# Patient Record
Sex: Male | Born: 1975 | Race: Black or African American | Hispanic: No | Marital: Single | Smoking: Current every day smoker
Health system: Southern US, Community
[De-identification: ages and names within clinical notes are randomized; demographics above are authoritative.]

## PROBLEM LIST (undated history)

## (undated) DIAGNOSIS — M25569 Pain in unspecified knee: Secondary | ICD-10-CM

## (undated) DIAGNOSIS — K219 Gastro-esophageal reflux disease without esophagitis: Secondary | ICD-10-CM

## (undated) DIAGNOSIS — G8929 Other chronic pain: Secondary | ICD-10-CM

## (undated) HISTORY — PX: OTHER SURGICAL HISTORY: SHX169

---

## 1999-01-28 ENCOUNTER — Emergency Department (HOSPITAL_COMMUNITY): Admission: EM | Admit: 1999-01-28 | Discharge: 1999-01-28 | Payer: Self-pay | Admitting: Emergency Medicine

## 2003-09-06 ENCOUNTER — Emergency Department (HOSPITAL_COMMUNITY): Admission: EM | Admit: 2003-09-06 | Discharge: 2003-09-06 | Payer: Self-pay | Admitting: Emergency Medicine

## 2008-10-04 ENCOUNTER — Emergency Department (HOSPITAL_COMMUNITY): Admission: EM | Admit: 2008-10-04 | Discharge: 2008-10-04 | Payer: Self-pay | Admitting: Emergency Medicine

## 2010-11-15 ENCOUNTER — Emergency Department (HOSPITAL_COMMUNITY)
Admission: EM | Admit: 2010-11-15 | Discharge: 2010-11-15 | Disposition: A | Discharge status: Home / Self Care | Payer: Self-pay | Attending: Emergency Medicine | Admitting: Emergency Medicine

## 2010-11-15 DIAGNOSIS — F17200 Nicotine dependence, unspecified, uncomplicated: Secondary | ICD-10-CM | POA: Insufficient documentation

## 2010-11-15 DIAGNOSIS — H44009 Unspecified purulent endophthalmitis, unspecified eye: Secondary | ICD-10-CM | POA: Insufficient documentation

## 2010-11-18 LAB — CULTURE, ROUTINE-ABSCESS

## 2012-06-05 ENCOUNTER — Emergency Department (HOSPITAL_COMMUNITY)
Admission: EM | Admit: 2012-06-05 | Discharge: 2012-06-05 | Disposition: A | Discharge status: Home / Self Care | Payer: Self-pay | Attending: Emergency Medicine | Admitting: Emergency Medicine

## 2012-06-05 ENCOUNTER — Emergency Department (HOSPITAL_COMMUNITY): Payer: Self-pay

## 2012-06-05 ENCOUNTER — Encounter (HOSPITAL_COMMUNITY): Payer: Self-pay | Admitting: *Deleted

## 2012-06-05 DIAGNOSIS — M545 Low back pain, unspecified: Secondary | ICD-10-CM | POA: Insufficient documentation

## 2012-06-05 DIAGNOSIS — K0889 Other specified disorders of teeth and supporting structures: Secondary | ICD-10-CM

## 2012-06-05 DIAGNOSIS — K089 Disorder of teeth and supporting structures, unspecified: Secondary | ICD-10-CM | POA: Insufficient documentation

## 2012-06-05 DIAGNOSIS — F17200 Nicotine dependence, unspecified, uncomplicated: Secondary | ICD-10-CM | POA: Insufficient documentation

## 2012-06-05 DIAGNOSIS — R1084 Generalized abdominal pain: Secondary | ICD-10-CM | POA: Insufficient documentation

## 2012-06-05 DIAGNOSIS — R109 Unspecified abdominal pain: Secondary | ICD-10-CM

## 2012-06-05 LAB — CBC WITH DIFFERENTIAL/PLATELET
Basophils Absolute: 0.1 10*3/uL (ref 0.0–0.1)
Eosinophils Relative: 3 % (ref 0–5)
HCT: 41.7 % (ref 39.0–52.0)
Lymphocytes Relative: 43 % (ref 12–46)
Lymphs Abs: 2.8 10*3/uL (ref 0.7–4.0)
MCV: 92.5 fL (ref 78.0–100.0)
Monocytes Absolute: 0.4 10*3/uL (ref 0.1–1.0)
Neutro Abs: 3.1 10*3/uL (ref 1.7–7.7)
RBC: 4.51 MIL/uL (ref 4.22–5.81)
RDW: 13.4 % (ref 11.5–15.5)
WBC: 6.5 10*3/uL (ref 4.0–10.5)

## 2012-06-05 LAB — COMPREHENSIVE METABOLIC PANEL
ALT: 12 U/L (ref 0–53)
AST: 15 U/L (ref 0–37)
CO2: 26 mEq/L (ref 19–32)
Calcium: 9.1 mg/dL (ref 8.4–10.5)
Chloride: 103 mEq/L (ref 96–112)
Creatinine, Ser: 1.35 mg/dL (ref 0.50–1.35)
GFR calc Af Amer: 77 mL/min — ABNORMAL LOW (ref >90)
GFR calc non Af Amer: 67 mL/min — ABNORMAL LOW (ref >90)
Glucose, Bld: 91 mg/dL (ref 70–99)
Sodium: 138 mEq/L (ref 135–145)
Total Bilirubin: 0.7 mg/dL (ref 0.3–1.2)

## 2012-06-05 LAB — URINALYSIS, ROUTINE W REFLEX MICROSCOPIC
Glucose, UA: NEGATIVE mg/dL
Ketones, ur: NEGATIVE mg/dL
Protein, ur: NEGATIVE mg/dL
Urobilinogen, UA: 1 mg/dL (ref 0.0–1.0)

## 2012-06-05 MED ORDER — HYDROCODONE-ACETAMINOPHEN 5-325 MG PO TABS: 2.0000 | ORAL_TABLET | ORAL | Status: DC | PRN

## 2012-06-05 MED ORDER — DIPHENHYDRAMINE HCL 50 MG/ML IJ SOLN
50.0000 mg | Freq: Once | INTRAMUSCULAR | Status: AC
  Administered 2012-06-05: 50 mg via INTRAVENOUS

## 2012-06-05 MED ORDER — ONDANSETRON HCL 4 MG PO TABS: 4.0000 mg | ORAL_TABLET | Freq: Four times a day (QID) | ORAL | Status: DC

## 2012-06-05 MED ORDER — PENICILLIN V POTASSIUM 500 MG PO TABS: 500.0000 mg | ORAL_TABLET | Freq: Four times a day (QID) | ORAL | Status: AC

## 2012-06-05 MED ORDER — ONDANSETRON HCL 4 MG/2ML IJ SOLN
4.0000 mg | Freq: Once | INTRAMUSCULAR | Status: AC
Start: 2012-06-05 — End: 2012-06-05
  Administered 2012-06-05: 4 mg via INTRAVENOUS
  Filled 2012-06-05: qty 2

## 2012-06-05 MED ORDER — IOHEXOL 300 MG/ML  SOLN
100.0000 mL | Freq: Once | INTRAMUSCULAR | Status: AC | PRN
  Administered 2012-06-05: 100 mL via INTRAVENOUS

## 2012-06-05 MED ORDER — DIPHENHYDRAMINE HCL 50 MG/ML IJ SOLN
INTRAMUSCULAR | Status: AC
  Administered 2012-06-05: 50 mg via INTRAVENOUS
  Filled 2012-06-05: qty 1

## 2012-06-05 MED ORDER — SODIUM CHLORIDE 0.9 % IV BOLUS (SEPSIS)
1000.0000 mL | Freq: Once | INTRAVENOUS | Status: AC
  Administered 2012-06-05: 1000 mL via INTRAVENOUS
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
## 2012-06-05 NOTE — ED Notes (Signed)
Lower abd pain x 1 wk.  Denies n/v/d.  Denies GU sx.

## 2012-06-05 NOTE — ED Notes (Signed)
Pt in CT.

## 2012-06-05 NOTE — ED Notes (Signed)
PT reported itching and whelps to trunk and arms. EDP aware and benadryl given.

## 2012-06-08 ENCOUNTER — Emergency Department (HOSPITAL_COMMUNITY): Payer: Self-pay

## 2012-06-08 ENCOUNTER — Encounter (HOSPITAL_COMMUNITY): Payer: Self-pay | Admitting: *Deleted

## 2012-06-08 ENCOUNTER — Emergency Department (HOSPITAL_COMMUNITY)
Admission: EM | Admit: 2012-06-08 | Discharge: 2012-06-08 | Disposition: A | Discharge status: Home / Self Care | Payer: Self-pay | Attending: Emergency Medicine | Admitting: Emergency Medicine

## 2012-06-08 DIAGNOSIS — R197 Diarrhea, unspecified: Secondary | ICD-10-CM | POA: Insufficient documentation

## 2012-06-08 DIAGNOSIS — R109 Unspecified abdominal pain: Secondary | ICD-10-CM

## 2012-06-08 DIAGNOSIS — R1032 Left lower quadrant pain: Secondary | ICD-10-CM | POA: Insufficient documentation

## 2012-06-08 DIAGNOSIS — F17200 Nicotine dependence, unspecified, uncomplicated: Secondary | ICD-10-CM | POA: Insufficient documentation

## 2012-06-08 LAB — CBC WITH DIFFERENTIAL/PLATELET
Basophils Absolute: 0.1 10*3/uL (ref 0.0–0.1)
Basophils Relative: 1 % (ref 0–1)
Eosinophils Absolute: 0.3 10*3/uL (ref 0.0–0.7)
Eosinophils Relative: 3 % (ref 0–5)
HCT: 42.6 % (ref 39.0–52.0)
Hemoglobin: 15.1 g/dL (ref 13.0–17.0)
Lymphocytes Relative: 43 % (ref 12–46)
Lymphs Abs: 3.6 10*3/uL (ref 0.7–4.0)
MCH: 32.6 pg (ref 26.0–34.0)
MCHC: 35.4 g/dL (ref 30.0–36.0)
MCV: 92 fL (ref 78.0–100.0)
Monocytes Absolute: 0.6 10*3/uL (ref 0.1–1.0)
Monocytes Relative: 7 % (ref 3–12)
Neutro Abs: 4 10*3/uL (ref 1.7–7.7)
Neutrophils Relative %: 46 % (ref 43–77)
Platelets: 311 10*3/uL (ref 150–400)
RBC: 4.63 MIL/uL (ref 4.22–5.81)
RDW: 13.5 % (ref 11.5–15.5)
WBC: 8.5 10*3/uL (ref 4.0–10.5)

## 2012-06-08 LAB — COMPREHENSIVE METABOLIC PANEL WITH GFR
ALT: 12 U/L (ref 0–53)
AST: 15 U/L (ref 0–37)
Albumin: 3.8 g/dL (ref 3.5–5.2)
Alkaline Phosphatase: 104 U/L (ref 39–117)
BUN: 9 mg/dL (ref 6–23)
CO2: 24 meq/L (ref 19–32)
Calcium: 9.6 mg/dL (ref 8.4–10.5)
Chloride: 100 meq/L (ref 96–112)
Creatinine, Ser: 1.08 mg/dL (ref 0.50–1.35)
GFR calc Af Amer: >90 mL/min
GFR calc non Af Amer: 87 mL/min — ABNORMAL LOW
Glucose, Bld: 77 mg/dL (ref 70–99)
Potassium: 3.7 meq/L (ref 3.5–5.1)
Sodium: 136 meq/L (ref 135–145)
Total Bilirubin: 0.5 mg/dL (ref 0.3–1.2)
Total Protein: 7.6 g/dL (ref 6.0–8.3)

## 2012-06-08 LAB — RAPID URINE DRUG SCREEN, HOSP PERFORMED
Amphetamines: NOT DETECTED
Barbiturates: NOT DETECTED
Benzodiazepines: NOT DETECTED
Cocaine: NOT DETECTED
Opiates: NOT DETECTED
Tetrahydrocannabinol: POSITIVE — AB

## 2012-06-08 LAB — URINALYSIS, ROUTINE W REFLEX MICROSCOPIC
Bilirubin Urine: NEGATIVE
Glucose, UA: NEGATIVE mg/dL
Hgb urine dipstick: NEGATIVE
Ketones, ur: NEGATIVE mg/dL
Leukocytes, UA: NEGATIVE
Nitrite: NEGATIVE
Protein, ur: NEGATIVE mg/dL
Specific Gravity, Urine: 1.025 (ref 1.005–1.030)
Urobilinogen, UA: 0.2 mg/dL (ref 0.0–1.0)
pH: 6 (ref 5.0–8.0)

## 2012-06-08 LAB — LIPASE, BLOOD: Lipase: 20 U/L (ref 11–59)

## 2012-06-08 LAB — LACTIC ACID, PLASMA: Lactic Acid, Venous: 1.3 mmol/L (ref 0.5–2.2)

## 2012-06-08 MED ORDER — DOCUSATE SODIUM 100 MG PO CAPS: 100.0000 mg | ORAL_CAPSULE | Freq: Two times a day (BID) | ORAL | Status: DC

## 2012-06-08 MED ORDER — SENNA-DOCUSATE SODIUM 8.6-50 MG PO TABS: 1.0000 | ORAL_TABLET | Freq: Every day | ORAL | Status: DC

## 2012-06-08 NOTE — ED Notes (Signed)
Pt co abdominal pain for 2 weeks, not getting better, seen for same 2 days prior, denies n/v/d
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
## 2012-06-10 ENCOUNTER — Emergency Department (HOSPITAL_COMMUNITY)
Admission: EM | Admit: 2012-06-10 | Discharge: 2012-06-10 | Disposition: A | Discharge status: Home / Self Care | Payer: Self-pay | Attending: Emergency Medicine | Admitting: Emergency Medicine

## 2012-06-10 ENCOUNTER — Encounter (HOSPITAL_COMMUNITY): Payer: Self-pay

## 2012-06-10 DIAGNOSIS — R630 Anorexia: Secondary | ICD-10-CM | POA: Insufficient documentation

## 2012-06-10 DIAGNOSIS — K5900 Constipation, unspecified: Secondary | ICD-10-CM | POA: Insufficient documentation

## 2012-06-10 DIAGNOSIS — K2970 Gastritis, unspecified, without bleeding: Secondary | ICD-10-CM | POA: Insufficient documentation

## 2012-06-10 DIAGNOSIS — F17200 Nicotine dependence, unspecified, uncomplicated: Secondary | ICD-10-CM | POA: Insufficient documentation

## 2012-06-10 MED ORDER — SUCRALFATE 1 GM/10ML PO SUSP: 1.0000 g | Freq: Four times a day (QID) | ORAL | Status: DC

## 2012-06-10 MED ORDER — GI COCKTAIL ~~LOC~~
30.0000 mL | Freq: Once | ORAL | Status: AC
  Administered 2012-06-10: 30 mL via ORAL
  Filled 2012-06-10: qty 30

## 2012-06-10 MED ORDER — RANITIDINE HCL 150 MG PO TABS: 150.0000 mg | ORAL_TABLET | Freq: Two times a day (BID) | ORAL | Status: DC
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
## 2012-06-10 NOTE — ED Notes (Signed)
Pt with continued abd pain, LBM this morning per pt.

## 2012-06-10 NOTE — ED Notes (Signed)
Complain of abdomen hurting for two weeks. States he was here Wednesday and Friday for same

## 2012-07-03 ENCOUNTER — Encounter: Payer: Self-pay | Admitting: *Deleted

## 2012-11-29 ENCOUNTER — Emergency Department (HOSPITAL_COMMUNITY)
Admission: EM | Admit: 2012-11-29 | Discharge: 2012-11-29 | Disposition: A | Discharge status: Home / Self Care | Payer: Self-pay | Attending: Emergency Medicine | Admitting: Emergency Medicine

## 2012-11-29 ENCOUNTER — Encounter (HOSPITAL_COMMUNITY): Payer: Self-pay | Admitting: *Deleted

## 2012-11-29 DIAGNOSIS — K089 Disorder of teeth and supporting structures, unspecified: Secondary | ICD-10-CM | POA: Insufficient documentation

## 2012-11-29 DIAGNOSIS — K0889 Other specified disorders of teeth and supporting structures: Secondary | ICD-10-CM

## 2012-11-29 DIAGNOSIS — Z79899 Other long term (current) drug therapy: Secondary | ICD-10-CM | POA: Insufficient documentation

## 2012-11-29 DIAGNOSIS — F17200 Nicotine dependence, unspecified, uncomplicated: Secondary | ICD-10-CM | POA: Insufficient documentation

## 2012-11-29 MED ORDER — HYDROCODONE-ACETAMINOPHEN 5-325 MG PO TABS: ORAL_TABLET | ORAL | Status: DC

## 2012-11-29 MED ORDER — AMOXICILLIN 500 MG PO CAPS: 500.0000 mg | ORAL_CAPSULE | Freq: Three times a day (TID) | ORAL | Status: DC

## 2012-11-29 MED ORDER — AMOXICILLIN 250 MG PO CAPS
500.0000 mg | ORAL_CAPSULE | Freq: Once | ORAL | Status: AC
  Administered 2012-11-29: 500 mg via ORAL
  Filled 2012-11-29: qty 2

## 2012-11-29 MED ORDER — OXYCODONE-ACETAMINOPHEN 5-325 MG PO TABS
1.0000 | ORAL_TABLET | Freq: Once | ORAL | Status: AC
  Administered 2012-11-29: 1 via ORAL
  Filled 2012-11-29: qty 1

## 2012-11-29 NOTE — ED Notes (Signed)
Dental pain, rt mandibular molar , no facial swelling

## 2012-11-29 NOTE — ED Provider Notes (Signed)
History    CSN: [id number] Arrival date & time 11/29/12  1811  First MD Initiated Contact with Patient 11/29/12 1819     Chief Complaint  Patient presents with  . Dental Pain   (Consider location/radiation/quality/duration/timing/severity/associated sxs/prior Treatment) HPI Comments: [name] is a 37 y.o. male who presents to the Emergency Department complaining of dental pain for 6 days. States pain to the right lower tooth has been worsening for several days. Pain is worse with hot or cold food or fluids. He states he is unable to see a dentist do to lack of insurance. He denies neck pain, difficulty swallowing or breathing, fever, or facial swelling.  Nothing makes the pain better.   Patient is a 37 y.o. male presenting with tooth pain. The history is provided by the patient.  Dental Pain Location:  Lower Lower teeth location:  31/RL 2nd molar Severity:  Moderate Onset quality:  Gradual Progression:  Worsening Chronicity:  New Context: dental caries   Relieved by:  Nothing Associated symptoms: no congestion, no facial swelling, no fever, no gum swelling, no headaches, no neck pain, no neck swelling, no oral bleeding and no trismus   Risk factors: periodontal disease and smoking    History reviewed. No pertinent past medical history. History reviewed. No pertinent past surgical history. No family history on file. History  Substance Use Topics  . Smoking status: Current Every Day Smoker    Types: Cigarettes  . Smokeless tobacco: Not on file  . Alcohol Use: Yes     Comment: daily    Review of Systems  Constitutional: Negative for fever and appetite change.  HENT: Positive for dental problem. Negative for congestion, sore throat, facial swelling, trouble swallowing, neck pain and neck stiffness.   Eyes: Negative for pain and visual disturbance.  Neurological: Negative for dizziness, facial asymmetry and headaches.  Hematological: Negative for adenopathy.  All  other systems reviewed and are negative.    Allergies  Contrast media  Home Medications   Current Outpatient Rx  Name  Route  Sig  Dispense  Refill  . docusate sodium (COLACE) 100 MG capsule   Oral   Take 1 capsule (100 mg total) by mouth every 12 (twelve) hours.   60 capsule   0   . HYDROcodone-acetaminophen (NORCO/VICODIN) 5-325 MG per tablet   Oral   Take 2 tablets by mouth every 4 (four) hours as needed for pain.   10 tablet   0   . ondansetron (ZOFRAN) 4 MG tablet   Oral   Take 1 tablet (4 mg total) by mouth every 6 (six) hours.   12 tablet   0   . ranitidine (ZANTAC) 150 MG tablet   Oral   Take 1 tablet (150 mg total) by mouth 2 (two) times daily.   60 tablet   0   . sennosides-docusate sodium (SENOKOT-S) 8.6-50 MG tablet   Oral   Take 1 tablet by mouth daily.   30 tablet   0   . sucralfate (CARAFATE) 1 GM/10ML suspension   Oral   Take 10 mLs (1 g total) by mouth 4 (four) times daily.   420 mL   0    BP 134/68  Pulse 81  Temp(Src) 100.5 F (38.1 C) (Oral)  Resp 24  Ht 5\' 11"  (1.803 m)  Wt 184 lb (83.462 kg)  BMI 25.67 kg/m2  SpO2 96% Physical Exam  Nursing note and vitals reviewed. Constitutional: He is oriented to person, place, and  time. He appears well-developed and well-nourished. No distress.  HENT:  Head: Normocephalic and atraumatic. No trismus in the jaw.  Right Ear: Tympanic membrane and ear canal normal.  Left Ear: Tympanic membrane and ear canal normal.  Mouth/Throat: Uvula is midline, oropharynx is clear and moist and mucous membranes are normal. Dental caries present. No dental abscesses or edematous.    Partial dental avulsion and decay. No erythema or edema of the surrounding gums. No facial edema, trismus, or sublingual abnormalities.  Neck: Normal range of motion. Neck supple. No thyromegaly present.  Cardiovascular: Normal rate, regular rhythm, normal heart sounds and intact distal pulses.   No murmur  heard. Pulmonary/Chest: Effort normal and breath sounds normal. No respiratory distress.  Musculoskeletal: Normal range of motion.  Lymphadenopathy:    He has no cervical adenopathy.  Neurological: He is alert and oriented to person, place, and time. He exhibits normal muscle tone. Coordination normal.  Skin: Skin is warm and dry.    ED Course  Procedures (including critical care time) Labs Reviewed - No data to display   MDM     VSS.  Patient is nontoxic appearing and stable for discharge. I will give him a referral information for the local dentists office.  No clinical sx's to suggest Ludwig's angina  Nakeitha Milligan L. Trisha Mangle, PA-C 11/29/12 1837

## 2012-11-29 NOTE — ED Notes (Signed)
Dental pain to right side x 6 days.

## 2012-11-29 NOTE — ED Provider Notes (Signed)
Medical screening examination/treatment/procedure(s) were performed by non-physician practitioner and as supervising physician I was immediately available for consultation/collaboration. Elois Averitt, MD, FACEP   Osiris Charles L Shalece Staffa, MD 11/29/12 2328 

## 2013-01-28 ENCOUNTER — Encounter (HOSPITAL_COMMUNITY): Payer: Self-pay | Admitting: *Deleted

## 2013-01-28 ENCOUNTER — Emergency Department (HOSPITAL_COMMUNITY)
Admission: EM | Admit: 2013-01-28 | Discharge: 2013-01-28 | Disposition: A | Discharge status: Home / Self Care | Payer: Self-pay | Attending: Emergency Medicine | Admitting: Emergency Medicine

## 2013-01-28 ENCOUNTER — Emergency Department (HOSPITAL_COMMUNITY): Payer: Self-pay

## 2013-01-28 DIAGNOSIS — F17200 Nicotine dependence, unspecified, uncomplicated: Secondary | ICD-10-CM | POA: Insufficient documentation

## 2013-01-28 DIAGNOSIS — K5900 Constipation, unspecified: Secondary | ICD-10-CM | POA: Insufficient documentation

## 2013-01-28 DIAGNOSIS — K2970 Gastritis, unspecified, without bleeding: Secondary | ICD-10-CM | POA: Insufficient documentation

## 2013-01-28 DIAGNOSIS — R112 Nausea with vomiting, unspecified: Secondary | ICD-10-CM | POA: Insufficient documentation

## 2013-01-28 DIAGNOSIS — R509 Fever, unspecified: Secondary | ICD-10-CM | POA: Insufficient documentation

## 2013-01-28 LAB — CBC WITH DIFFERENTIAL/PLATELET
Basophils Absolute: 0.2 10*3/uL — ABNORMAL HIGH (ref 0.0–0.1)
HCT: 48.5 % (ref 39.0–52.0)
Hemoglobin: 16.8 g/dL (ref 13.0–17.0)
Lymphocytes Relative: 46 % (ref 12–46)
Monocytes Relative: 14 % — ABNORMAL HIGH (ref 3–12)
Neutro Abs: 2.2 10*3/uL (ref 1.7–7.7)
Neutrophils Relative %: 34 % — ABNORMAL LOW (ref 43–77)
RDW: 13.2 % (ref 11.5–15.5)
WBC: 6.6 10*3/uL (ref 4.0–10.5)

## 2013-01-28 LAB — COMPREHENSIVE METABOLIC PANEL
ALT: 52 U/L (ref 0–53)
AST: 47 U/L — ABNORMAL HIGH (ref 0–37)
Albumin: 3.8 g/dL (ref 3.5–5.2)
Alkaline Phosphatase: 100 U/L (ref 39–117)
Calcium: 9.4 mg/dL (ref 8.4–10.5)
Glucose, Bld: 102 mg/dL — ABNORMAL HIGH (ref 70–99)
Potassium: 4 mEq/L (ref 3.5–5.1)
Sodium: 135 mEq/L (ref 135–145)
Total Protein: 7.8 g/dL (ref 6.0–8.3)

## 2013-01-28 LAB — URINALYSIS, ROUTINE W REFLEX MICROSCOPIC
Bilirubin Urine: NEGATIVE
Glucose, UA: NEGATIVE mg/dL
Specific Gravity, Urine: 1.02 (ref 1.005–1.030)
pH: 6 (ref 5.0–8.0)

## 2013-01-28 LAB — URINE MICROSCOPIC-ADD ON

## 2013-01-28 MED ORDER — GI COCKTAIL ~~LOC~~
30.0000 mL | Freq: Once | ORAL | Status: AC
  Administered 2013-01-28: 30 mL via ORAL
  Filled 2013-01-28: qty 30

## 2013-01-28 MED ORDER — SODIUM CHLORIDE 0.9 % IV BOLUS (SEPSIS)
1000.0000 mL | Freq: Once | INTRAVENOUS | Status: AC
  Administered 2013-01-28: 1000 mL via INTRAVENOUS

## 2013-01-28 MED ORDER — ONDANSETRON HCL 4 MG/2ML IJ SOLN
4.0000 mg | Freq: Once | INTRAMUSCULAR | Status: AC
  Administered 2013-01-28: 4 mg via INTRAVENOUS
  Filled 2013-01-28: qty 2

## 2013-01-28 MED ORDER — PANTOPRAZOLE SODIUM 40 MG IV SOLR
40.0000 mg | Freq: Once | INTRAVENOUS | Status: AC
  Administered 2013-01-28: 40 mg via INTRAVENOUS
  Filled 2013-01-28: qty 40

## 2013-01-28 MED ORDER — PANTOPRAZOLE SODIUM 40 MG PO TBEC: 40.0000 mg | DELAYED_RELEASE_TABLET | Freq: Every day | ORAL | Status: DC

## 2013-01-28 MED ORDER — HYDROMORPHONE HCL PF 1 MG/ML IJ SOLN
1.0000 mg | Freq: Once | INTRAMUSCULAR | Status: AC
  Administered 2013-01-28: 1 mg via INTRAVENOUS
  Filled 2013-01-28: qty 1

## 2013-01-28 MED ORDER — ONDANSETRON HCL 4 MG PO TABS: 4.0000 mg | ORAL_TABLET | Freq: Three times a day (TID) | ORAL | Status: DC | PRN

## 2013-01-28 NOTE — ED Provider Notes (Signed)
CSN: [id number]     Arrival date & time 01/28/13  1435 History  This chart was scribed for Charles B. Bernette Mayers, MD by Leone Payor, ED Scribe. This patient was seen in room APA05/[facility]05 and the patient's care was started 3:21 PM.    Chief Complaint  Patient presents with  . Abdominal Pain    The history is provided by the patient. No language interpreter was used.    HPI Comments: [name] is a 36 y.o. male who presents to the Emergency Department complaining of constant, non-radiating, unchanged upper abdominal pain that began 3 days ago. Pt has had associated nausea and occasional episodes of emesis, fever, and constipation. He has had similar symptoms in the past. He has taken tylenol for the fever. He denies diarrhea. He admits to everyday alcohol consumption.   History reviewed. No pertinent past medical history. History reviewed. No pertinent past surgical history. History reviewed. No pertinent family history. History  Substance Use Topics  . Smoking status: Current Every Day Smoker    Types: Cigarettes  . Smokeless tobacco: Not on file  . Alcohol Use: Yes     Comment: daily    Review of Systems A complete 10 system review of systems was obtained and all systems are negative except as noted in the HPI and PMH.   Allergies  Contrast media  Home Medications   Current Outpatient Rx  Name  Route  Sig  Dispense  Refill  . amoxicillin (AMOXIL) 500 MG capsule   Oral   Take 1 capsule (500 mg total) by mouth 3 (three) times daily.   30 capsule   0   . docusate sodium (COLACE) 100 MG capsule   Oral   Take 1 capsule (100 mg total) by mouth every 12 (twelve) hours.   60 capsule   0   . HYDROcodone-acetaminophen (NORCO/VICODIN) 5-325 MG per tablet   Oral   Take 2 tablets by mouth every 4 (four) hours as needed for pain.   10 tablet   0   . HYDROcodone-acetaminophen (NORCO/VICODIN) 5-325 MG per tablet      Take one-two tabs po q 4-6 hrs prn pain   20 tablet    0   . ondansetron (ZOFRAN) 4 MG tablet   Oral   Take 1 tablet (4 mg total) by mouth every 6 (six) hours.   12 tablet   0   . ranitidine (ZANTAC) 150 MG tablet   Oral   Take 1 tablet (150 mg total) by mouth 2 (two) times daily.   60 tablet   0   . sennosides-docusate sodium (SENOKOT-S) 8.6-50 MG tablet   Oral   Take 1 tablet by mouth daily.   30 tablet   0   . sucralfate (CARAFATE) 1 GM/10ML suspension   Oral   Take 10 mLs (1 g total) by mouth 4 (four) times daily.   420 mL   0    BP 119/74  Pulse 92  Temp(Src) 100.6 F (38.1 C) (Oral)  Resp 20  Ht 5\' 11"  (1.803 m)  Wt 180 lb (81.647 kg)  BMI 25.12 kg/m2  SpO2 100% Physical Exam  Nursing note and vitals reviewed. Constitutional: He is oriented to person, place, and time. He appears well-developed and well-nourished.  HENT:  Head: Normocephalic and atraumatic.  Eyes: EOM are normal. Pupils are equal, round, and reactive to light.  Neck: Normal range of motion. Neck supple.  Cardiovascular: Normal rate, normal heart sounds and intact distal  pulses.   Pulmonary/Chest: Effort normal and breath sounds normal.  Abdominal: Bowel sounds are normal. He exhibits no distension. There is tenderness (diffuse but worse in epigastric). There is guarding. There is no rebound.  Musculoskeletal: Normal range of motion. He exhibits no edema and no tenderness.  Neurological: He is alert and oriented to person, place, and time. He has normal strength. No cranial nerve deficit or sensory deficit.  Skin: Skin is warm and dry. No rash noted.  Psychiatric: He has a normal mood and affect.    ED Course  DIAGNOSTIC STUDIES: Oxygen Saturation is 100% on RA, normal by my interpretation.    COORDINATION OF CARE: 3:24 PM Discussed treatment plan with pt at bedside and pt agreed to plan.   Procedures (including critical care time)  Labs Reviewed  CBC WITH DIFFERENTIAL - Abnormal; Notable for the following:    Neutrophils Relative % 34  (*)    Monocytes Relative 14 (*)    Basophils Relative 3 (*)    Basophils Absolute 0.2 (*)    All other components within normal limits  COMPREHENSIVE METABOLIC PANEL - Abnormal; Notable for the following:    Glucose, Bld 102 (*)    AST 47 (*)    GFR calc non Af Amer 86 (*)    All other components within normal limits  URINALYSIS, ROUTINE W REFLEX MICROSCOPIC - Abnormal; Notable for the following:    Hgb urine dipstick TRACE (*)    All other components within normal limits  LIPASE, BLOOD  URINE MICROSCOPIC-ADD ON   Dg Abd Acute W/chest  01/28/2013   *RADIOLOGY REPORT*  Clinical Data: Abdominal pain, nausea and fever.  ACUTE ABDOMEN SERIES (ABDOMEN 2 VIEW & CHEST 1 VIEW)  Comparison: 06/08/2012.  Findings: Frontal view of the chest shows midline trachea and normal heart size.  Lungs are clear.  No pleural fluid.  Two views of the abdomen show gas in nondilated small bowel and colon.  Stool is seen in the descending and rectosigmoid colon.  No unexpected radiopaque calculi.  IMPRESSION: No acute findings.  Possible constipation.   Original Report Authenticated By: Leanna Battles, M.D.   1. Gastritis     MDM  Pt with history of gastritis, daily EtOH use has epigastric pain and vomiting with fever. Will evaluate for pancreatitis, consider PUD with perf as well, sent for AAS.   5:14 PM Labs and imaging unremarkable. No signs of pancreatitis or perforation. 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   I personally performed the services described in this documentation, which was scribed in my presence. The recorded information has been reviewed and is accurate.     Charles B. Bernette Mayers, MD 01/28/13 1715

## 2013-01-28 NOTE — ED Notes (Signed)
Upper abd pain, N/V,  No diarrhea

## 2013-01-28 NOTE — ED Notes (Signed)
Pt given discharge instructions and verbalized understanding. Vital signs are WNL. NAD.

## 2013-02-03 ENCOUNTER — Encounter (HOSPITAL_COMMUNITY): Payer: Self-pay | Admitting: *Deleted

## 2013-02-03 ENCOUNTER — Emergency Department (HOSPITAL_COMMUNITY)
Admission: EM | Admit: 2013-02-03 | Discharge: 2013-02-03 | Disposition: A | Discharge status: Home / Self Care | Payer: Self-pay | Attending: Emergency Medicine | Admitting: Emergency Medicine

## 2013-02-03 ENCOUNTER — Emergency Department (HOSPITAL_COMMUNITY): Payer: Self-pay

## 2013-02-03 DIAGNOSIS — K117 Disturbances of salivary secretion: Secondary | ICD-10-CM | POA: Insufficient documentation

## 2013-02-03 DIAGNOSIS — K2920 Alcoholic gastritis without bleeding: Secondary | ICD-10-CM | POA: Insufficient documentation

## 2013-02-03 DIAGNOSIS — R112 Nausea with vomiting, unspecified: Secondary | ICD-10-CM

## 2013-02-03 DIAGNOSIS — K5900 Constipation, unspecified: Secondary | ICD-10-CM | POA: Insufficient documentation

## 2013-02-03 DIAGNOSIS — F17200 Nicotine dependence, unspecified, uncomplicated: Secondary | ICD-10-CM | POA: Insufficient documentation

## 2013-02-03 DIAGNOSIS — R5381 Other malaise: Secondary | ICD-10-CM | POA: Insufficient documentation

## 2013-02-03 DIAGNOSIS — R42 Dizziness and giddiness: Secondary | ICD-10-CM | POA: Insufficient documentation

## 2013-02-03 DIAGNOSIS — Z79899 Other long term (current) drug therapy: Secondary | ICD-10-CM | POA: Insufficient documentation

## 2013-02-03 DIAGNOSIS — K7010 Alcoholic hepatitis without ascites: Secondary | ICD-10-CM | POA: Insufficient documentation

## 2013-02-03 DIAGNOSIS — R509 Fever, unspecified: Secondary | ICD-10-CM | POA: Insufficient documentation

## 2013-02-03 LAB — CBC WITH DIFFERENTIAL/PLATELET
Basophils Relative: 5 % — ABNORMAL HIGH (ref 0–1)
HCT: 44.4 % (ref 39.0–52.0)
Hemoglobin: 15.6 g/dL (ref 13.0–17.0)
Lymphocytes Relative: 55 % — ABNORMAL HIGH (ref 12–46)
Lymphs Abs: 4.7 10*3/uL — ABNORMAL HIGH (ref 0.7–4.0)
MCHC: 35.1 g/dL (ref 30.0–36.0)
MCV: 91.5 fL (ref 78.0–100.0)
Monocytes Relative: 13 % — ABNORMAL HIGH (ref 3–12)
Neutro Abs: 2.2 10*3/uL (ref 1.7–7.7)
RDW: 13.5 % (ref 11.5–15.5)
WBC: 8.5 10*3/uL (ref 4.0–10.5)

## 2013-02-03 LAB — COMPREHENSIVE METABOLIC PANEL
ALT: 73 U/L — ABNORMAL HIGH (ref 0–53)
Albumin: 3.5 g/dL (ref 3.5–5.2)
Alkaline Phosphatase: 90 U/L (ref 39–117)
Calcium: 9.1 mg/dL (ref 8.4–10.5)
GFR calc Af Amer: >90 mL/min (ref >90)
Glucose, Bld: 162 mg/dL — ABNORMAL HIGH (ref 70–99)
Potassium: 3.5 mEq/L (ref 3.5–5.1)
Sodium: 133 mEq/L — ABNORMAL LOW (ref 135–145)
Total Protein: 7.7 g/dL (ref 6.0–8.3)

## 2013-02-03 LAB — URINALYSIS, ROUTINE W REFLEX MICROSCOPIC
Glucose, UA: NEGATIVE mg/dL
Leukocytes, UA: NEGATIVE
Nitrite: NEGATIVE
Specific Gravity, Urine: 1.02 (ref 1.005–1.030)
pH: 6.5 (ref 5.0–8.0)

## 2013-02-03 LAB — LIPASE, BLOOD: Lipase: 24 U/L (ref 11–59)

## 2013-02-03 LAB — URINE MICROSCOPIC-ADD ON

## 2013-02-03 MED ORDER — DIPHENHYDRAMINE HCL 50 MG/ML IJ SOLN
25.0000 mg | Freq: Once | INTRAMUSCULAR | Status: AC
  Administered 2013-02-03: 25 mg via INTRAVENOUS
  Filled 2013-02-03: qty 1

## 2013-02-03 MED ORDER — FAMOTIDINE IN NACL 20-0.9 MG/50ML-% IV SOLN
20.0000 mg | Freq: Once | INTRAVENOUS | Status: AC
  Administered 2013-02-03: 20 mg via INTRAVENOUS
  Filled 2013-02-03: qty 50

## 2013-02-03 MED ORDER — LORAZEPAM 2 MG/ML IJ SOLN
1.0000 mg | Freq: Once | INTRAMUSCULAR | Status: AC
  Administered 2013-02-03: 1 mg via INTRAVENOUS
  Filled 2013-02-03: qty 1

## 2013-02-03 MED ORDER — SODIUM CHLORIDE 0.9 % IV SOLN
1000.0000 mL | Freq: Once | INTRAVENOUS | Status: AC
  Administered 2013-02-03: 1000 mL via INTRAVENOUS

## 2013-02-03 MED ORDER — METOCLOPRAMIDE HCL 5 MG/ML IJ SOLN
10.0000 mg | Freq: Once | INTRAMUSCULAR | Status: AC
  Administered 2013-02-03: 10 mg via INTRAVENOUS
  Filled 2013-02-03: qty 2

## 2013-02-03 MED ORDER — PROMETHAZINE HCL 25 MG PO TABS: 25.0000 mg | ORAL_TABLET | Freq: Four times a day (QID) | ORAL | Status: DC | PRN

## 2013-02-03 MED ORDER — SODIUM CHLORIDE 0.9 % IV SOLN: 1000.0000 mL | INTRAVENOUS | Status: DC

## 2013-02-03 NOTE — ED Notes (Signed)
Pt c/o abd pain, n/v that started 01/28/2013, was seen in er on 01/28/2013 for same, was not able to get his prescriptions refilled due to money issues. States that he is not feeling any better,

## 2013-02-03 NOTE — ED Provider Notes (Signed)
Scribed for No att. providers found, the patient was seen in room APA12/APA12. This chart was scribed by Lewanda Rife, ED scribe. Patient's care was started at 1708  CSN: [id number]     Arrival date & time 02/03/13  1514 History   First MD Initiated Contact with Patient 02/03/13 1643     Chief Complaint  Patient presents with  . Abdominal Pain   (Consider location/radiation/quality/duration/timing/severity/associated sxs/prior Treatment) The history is provided by the patient and medical records.   HPI Comments: [name] is a 37 y.o. male who presents to the Emergency Department complaining of waxing and waning severe epigastric pain onset 2 weeks. Describes pain as sharp. Reports episodes last 45 min. Reports symptoms are aggravated by nothing and alleviated by lying down. Reports associated constipation, subjective fever, black emesis, nausea, weakness and lightheadedness (when standing). Denies associated chills, and shakes. He reports vomiting about twice a day. Last BM was yesterday and was small and "balls". He states he was seen earlier this week for same and in January.    Reports he smokes 1/2 pack of cigarettes. Reports he drinks a 12 pack of beer and has not had any alcohol in 1 week. Reports he was seen here 01/28/13 for the same. Reports he did not fill prescriptions due to "money problems". Pt is unemployed.    PCP none  History reviewed. No pertinent past medical history. History reviewed. No pertinent past surgical history. No family history on file. History  Substance Use Topics  . Smoking status: Current Every Day Smoker    Types: Cigarettes  . Smokeless tobacco: Not on file  . Alcohol Use: Yes     Comment: daily  unemployed  Review of Systems  Gastrointestinal: Positive for abdominal pain.   A complete 10 system review of systems was obtained and all systems are negative except as noted in the HPI and PMH.    Allergies  Contrast media  Home  Medications   Current Outpatient Rx  Name  Route  Sig  Dispense  Refill  . acetaminophen (TYLENOL) 500 MG tablet   Oral   Take 1,000 mg by mouth as needed for pain.         Marland Kitchen ondansetron (ZOFRAN) 4 MG tablet   Oral   Take 1 tablet (4 mg total) by mouth every 8 (eight) hours as needed for nausea.   20 tablet   0   . pantoprazole (PROTONIX) 40 MG tablet   Oral   Take 1 tablet (40 mg total) by mouth daily.   30 tablet   0    BP 109/66  Pulse 87  Temp(Src) 99 F (37.2 C) (Oral)  Resp 20  Wt 167 lb 2 oz (75.807 kg)  BMI 23.32 kg/m2  SpO2 98%  Vital signs normal    Physical Exam  Nursing note and vitals reviewed. Constitutional: He is oriented to person, place, and time. He appears well-developed and well-nourished.  Non-toxic appearance. He does not appear ill. No distress.  HENT:  Head: Normocephalic and atraumatic.  Right Ear: External ear normal.  Left Ear: External ear normal.  Nose: Nose normal. No mucosal edema or rhinorrhea.  Mouth/Throat: Oropharynx is clear and moist. Mucous membranes are dry. No dental abscesses or edematous.  Eyes: Conjunctivae and EOM are normal. Pupils are equal, round, and reactive to light.  Neck: Normal range of motion and full passive range of motion without pain. Neck supple.  Cardiovascular: Normal rate, regular rhythm and normal heart sounds.  Exam reveals no gallop and no friction rub.   No murmur heard. Pulmonary/Chest: Effort normal and breath sounds normal. No respiratory distress. He has no wheezes. He has no rhonchi. He has no rales. He exhibits no tenderness and no crepitus.  Abdominal: Soft. Normal appearance and bowel sounds are normal. He exhibits no distension. There is tenderness. There is no rebound and no guarding.  Palpation of lower abdomen causes it to radiate to right and left upper quadrant exhibiting mild tenderness. TTP of epigastrium   Musculoskeletal: Normal range of motion. He exhibits no edema and no  tenderness.  Moves all extremities well.   Neurological: He is alert and oriented to person, place, and time. He has normal strength. No cranial nerve deficit.  Skin: Skin is warm, dry and intact. No rash noted. No erythema. No pallor.  Psychiatric: He has a normal mood and affect. His speech is normal and behavior is normal. His mood appears not anxious.    ED Course  Procedures (including critical care time) Medications  0.9 %  sodium chloride infusion (0 mLs Intravenous Stopped 02/03/13 2011)    Followed by  0.9 %  sodium chloride infusion (0 mLs Intravenous Stopped 02/03/13 2052)    Followed by  0.9 %  sodium chloride infusion (not administered)  metoCLOPramide (REGLAN) injection 10 mg (10 mg Intravenous Given 02/03/13 1744)  diphenhydrAMINE (BENADRYL) injection 25 mg (25 mg Intravenous Given 02/03/13 1744)  famotidine (PEPCID) IVPB 20 mg (0 mg Intravenous Stopped 02/03/13 1916)  LORazepam (ATIVAN) injection 1 mg (1 mg Intravenous Given 02/03/13 1744)   7:20 PM Nausea and pain resolved , feeling ready to go home.    Labs Review  Results for orders placed during the hospital encounter of 02/03/13  COMPREHENSIVE METABOLIC PANEL      Result Value Range   Sodium 133 (*) 135 - 145 mEq/L   Potassium 3.5  3.5 - 5.1 mEq/L   Chloride 97  96 - 112 mEq/L   CO2 26  19 - 32 mEq/L   Glucose, Bld 162 (*) 70 - 99 mg/dL   BUN 8  6 - 23 mg/dL   Creatinine, Ser 1.61  0.50 - 1.35 mg/dL   Calcium 9.1  8.4 - 09.6 mg/dL   Total Protein 7.7  6.0 - 8.3 g/dL   Albumin 3.5  3.5 - 5.2 g/dL   AST 49 (*) 0 - 37 U/L   ALT 73 (*) 0 - 53 U/L   Alkaline Phosphatase 90  39 - 117 U/L   Total Bilirubin 0.5  0.3 - 1.2 mg/dL   GFR calc non Af Amer 84 (*) >90 mL/min   GFR calc Af Amer >90  >90 mL/min  CBC WITH DIFFERENTIAL      Result Value Range   WBC 8.5  4.0 - 10.5 K/uL   RBC 4.85  4.22 - 5.81 MIL/uL   Hemoglobin 15.6  13.0 - 17.0 g/dL   HCT 04.5  40.9 - 81.1 %   MCV 91.5  78.0 - 100.0 fL   MCH 32.2   26.0 - 34.0 pg   MCHC 35.1  30.0 - 36.0 g/dL   RDW 91.4  78.2 - 95.6 %   Platelets 237  150 - 400 K/uL   Neutrophils Relative % 26 (*) 43 - 77 %   Lymphocytes Relative 55 (*) 12 - 46 %   Monocytes Relative 13 (*) 3 - 12 %   Eosinophils Relative 1  0 - 5 %  Basophils Relative 5 (*) 0 - 1 %   Neutro Abs 2.2  1.7 - 7.7 K/uL   Lymphs Abs 4.7 (*) 0.7 - 4.0 K/uL   Monocytes Absolute 1.1 (*) 0.1 - 1.0 K/uL   Eosinophils Absolute 0.1  0.0 - 0.7 K/uL   Basophils Absolute 0.4 (*) 0.0 - 0.1 K/uL   RBC Morphology POLYCHROMASIA PRESENT     WBC Morphology ATYPICAL LYMPHOCYTES    LIPASE, BLOOD      Result Value Range   Lipase 24  11 - 59 U/L  URINALYSIS, ROUTINE W REFLEX MICROSCOPIC      Result Value Range   Color, Urine AMBER (*) YELLOW   APPearance CLEAR  CLEAR   Specific Gravity, Urine 1.020  1.005 - 1.030   pH 6.5  5.0 - 8.0   Glucose, UA NEGATIVE  NEGATIVE mg/dL   Hgb urine dipstick TRACE (*) NEGATIVE   Bilirubin Urine SMALL (*) NEGATIVE   Ketones, ur NEGATIVE  NEGATIVE mg/dL   Protein, ur TRACE (*) NEGATIVE mg/dL   Urobilinogen, UA 2.0 (*) 0.0 - 1.0 mg/dL   Nitrite NEGATIVE  NEGATIVE   Leukocytes, UA NEGATIVE  NEGATIVE  URINE MICROSCOPIC-ADD ON      Result Value Range   RBC / HPF 0-2  <3 RBC/hpf   Laboratory interpretation all normal except mild hyperglycemia, hyponatremia, elevated liver enzymes   Imaging Review   Dg Abd Acute W/chest  02/03/2013   *RADIOLOGY REPORT*  Clinical Data: Upper abdominal pain  ACUTE ABDOMEN SERIES (ABDOMEN 2 VIEW & CHEST 1 VIEW)  Comparison: 01/28/2013  Findings: Pain heart size and vascular pattern are normal.  No effusions.  No free air.  Lungs are clear.  There is gas throughout small and large bowel.  IMPRESSION: No acute abnormalities.  Nonobstructive bowel gas pattern.   Original Report Authenticated By: Esperanza Heir, M.D.   Dg Abd Acute W/chest  01/28/2013   *RADIOLOGY REPORT*  Clinical Data: Abdominal pain, nausea and fever.  ACUTE ABDOMEN  SERIES (ABDOMEN 2 VIEW & CHEST 1 VIEW)  Comparison: 06/08/2012.  Findings: Frontal view of the chest shows midline trachea and normal heart size.  Lungs are clear.  No pleural fluid.  Two views of the abdomen show gas in nondilated small bowel and colon.  Stool is seen in the descending and rectosigmoid colon.  No unexpected radiopaque calculi.  IMPRESSION: No acute findings.  Possible constipation.   Original Report Authenticated By: Leanna Battles, M.D.     MDM   1. Alcoholic gastritis   2. Alcoholic hepatitis   3. Nausea and vomiting      Discharge Medication List as of 02/03/2013  8:43 PM    START taking these medications   Details  promethazine (PHENERGAN) 25 MG tablet Take 1 tablet (25 mg total) by mouth every 6 (six) hours as needed for nausea., Starting 02/03/2013, Until Discontinued, Print      OTC pepcid or prilosec  Plan discharge   Devoria Albe, MD, FACEP   I personally performed the services described in this documentation, which was scribed in my presence. The recorded information has been reviewed and considered.   Devoria Albe, MD, Armando Gang    Ward Givens, MD 02/03/13 2120

## 2013-02-06 ENCOUNTER — Encounter (HOSPITAL_COMMUNITY): Payer: Self-pay

## 2013-02-06 ENCOUNTER — Emergency Department (HOSPITAL_COMMUNITY)
Admission: EM | Admit: 2013-02-06 | Discharge: 2013-02-06 | Disposition: A | Discharge status: Home / Self Care | Payer: Self-pay | Attending: Emergency Medicine | Admitting: Emergency Medicine

## 2013-02-06 ENCOUNTER — Emergency Department (HOSPITAL_COMMUNITY): Payer: Self-pay

## 2013-02-06 DIAGNOSIS — K2970 Gastritis, unspecified, without bleeding: Secondary | ICD-10-CM | POA: Insufficient documentation

## 2013-02-06 DIAGNOSIS — Z79899 Other long term (current) drug therapy: Secondary | ICD-10-CM | POA: Insufficient documentation

## 2013-02-06 DIAGNOSIS — F17200 Nicotine dependence, unspecified, uncomplicated: Secondary | ICD-10-CM | POA: Insufficient documentation

## 2013-02-06 DIAGNOSIS — K922 Gastrointestinal hemorrhage, unspecified: Secondary | ICD-10-CM | POA: Insufficient documentation

## 2013-02-06 LAB — COMPREHENSIVE METABOLIC PANEL
ALT: 76 U/L — ABNORMAL HIGH (ref 0–53)
Albumin: 3.3 g/dL — ABNORMAL LOW (ref 3.5–5.2)
Alkaline Phosphatase: 86 U/L (ref 39–117)
BUN: 10 mg/dL (ref 6–23)
Calcium: 9 mg/dL (ref 8.4–10.5)
GFR calc Af Amer: >90 mL/min (ref >90)
Glucose, Bld: 126 mg/dL — ABNORMAL HIGH (ref 70–99)
Potassium: 3.7 mEq/L (ref 3.5–5.1)
Sodium: 133 mEq/L — ABNORMAL LOW (ref 135–145)
Total Protein: 7.6 g/dL (ref 6.0–8.3)

## 2013-02-06 LAB — URINALYSIS, ROUTINE W REFLEX MICROSCOPIC
Glucose, UA: NEGATIVE mg/dL
Ketones, ur: NEGATIVE mg/dL
Leukocytes, UA: NEGATIVE
Nitrite: NEGATIVE
Protein, ur: 30 mg/dL — AB

## 2013-02-06 LAB — CBC WITH DIFFERENTIAL/PLATELET
Basophils Absolute: 0.6 10*3/uL — ABNORMAL HIGH (ref 0.0–0.1)
Eosinophils Absolute: 0 10*3/uL (ref 0.0–0.7)
Lymphocytes Relative: 64 % — ABNORMAL HIGH (ref 12–46)
MCHC: 35.4 g/dL (ref 30.0–36.0)
Monocytes Relative: 8 % (ref 3–12)
Platelets: 220 10*3/uL (ref 150–400)
RDW: 13.3 % (ref 11.5–15.5)
WBC: 12.1 10*3/uL — ABNORMAL HIGH (ref 4.0–10.5)

## 2013-02-06 LAB — PROTIME-INR: INR: 1.09 (ref 0.00–1.49)

## 2013-02-06 LAB — LIPASE, BLOOD: Lipase: 21 U/L (ref 11–59)

## 2013-02-06 LAB — URINE MICROSCOPIC-ADD ON

## 2013-02-06 MED ORDER — RANITIDINE HCL 150 MG PO TABS: 150.0000 mg | ORAL_TABLET | Freq: Two times a day (BID) | ORAL | Status: DC

## 2013-02-06 MED ORDER — SUCRALFATE 1 GM/10ML PO SUSP: 1.0000 g | Freq: Four times a day (QID) | ORAL | Status: DC

## 2013-02-06 MED ORDER — SODIUM CHLORIDE 0.9 % IV BOLUS (SEPSIS)
1000.0000 mL | Freq: Once | INTRAVENOUS | Status: AC
  Administered 2013-02-06: 1000 mL via INTRAVENOUS

## 2013-02-06 MED ORDER — HYDROCODONE-ACETAMINOPHEN 5-325 MG PO TABS: 2.0000 | ORAL_TABLET | ORAL | Status: DC | PRN

## 2013-02-06 MED ORDER — PANTOPRAZOLE SODIUM 40 MG IV SOLR
40.0000 mg | Freq: Once | INTRAVENOUS | Status: AC
  Administered 2013-02-06: 40 mg via INTRAVENOUS
  Filled 2013-02-06: qty 40

## 2013-02-06 MED ORDER — ONDANSETRON HCL 4 MG/2ML IJ SOLN
4.0000 mg | Freq: Once | INTRAMUSCULAR | Status: AC
  Administered 2013-02-06: 4 mg via INTRAVENOUS
  Filled 2013-02-06: qty 2

## 2013-02-06 MED ORDER — MORPHINE SULFATE 4 MG/ML IJ SOLN
4.0000 mg | Freq: Once | INTRAMUSCULAR | Status: AC
  Administered 2013-02-06: 4 mg via INTRAVENOUS
  Filled 2013-02-06: qty 1

## 2013-02-06 NOTE — ED Provider Notes (Signed)
CSN: [id number]     Arrival date & time 02/06/13  1629 History   First MD Initiated Contact with Patient 02/06/13 1655     Chief Complaint  Patient presents with  . Abdominal Pain   (Consider location/radiation/quality/duration/timing/severity/associated sxs/prior Treatment) HPI Comments: Patient presents to ER for evaluation of continued abdominal pain. Patient reports that he is experiencing severe mid and upper abdominal pain for more than a week. Patient was seen in ER 3 days ago and told that he had gastritis. Was prescribed Phenergan and told not to drink. Patient reports he has not had alcohol since he was seen. Patient reports he has had continued nausea and vomiting and there has been blood in his vomit. Pain has been continuous, sharp and stabbing. No rectal bleeding. No fever.  Patient is a 37 y.o. male presenting with abdominal pain.  Abdominal Pain Associated symptoms: nausea and vomiting   Associated symptoms: no fever     History reviewed. No pertinent past medical history. History reviewed. No pertinent past surgical history. No family history on file. History  Substance Use Topics  . Smoking status: Current Every Day Smoker    Types: Cigarettes  . Smokeless tobacco: Not on file  . Alcohol Use: Yes     Comment: daily    Review of Systems  Constitutional: Negative for fever.  Gastrointestinal: Positive for nausea, vomiting and abdominal pain.  All other systems reviewed and are negative.    Allergies  Contrast media  Home Medications   Current Outpatient Rx  Name  Route  Sig  Dispense  Refill  . acetaminophen (TYLENOL) 500 MG tablet   Oral   Take 1,000 mg by mouth as needed for pain.         Marland Kitchen ondansetron (ZOFRAN) 4 MG tablet   Oral   Take 1 tablet (4 mg total) by mouth every 8 (eight) hours as needed for nausea.   20 tablet   0   . pantoprazole (PROTONIX) 40 MG tablet   Oral   Take 1 tablet (40 mg total) by mouth daily.   30 tablet   0   .  promethazine (PHENERGAN) 25 MG tablet   Oral   Take 1 tablet (25 mg total) by mouth every 6 (six) hours as needed for nausea.   10 tablet   0    BP 100/55  Pulse 92  Temp(Src) 99.3 F (37.4 C) (Oral)  Resp 20  Ht 5\' 11"  (1.803 m)  Wt 167 lb (75.751 kg)  BMI 23.3 kg/m2  SpO2 100% Physical Exam  Constitutional: He is oriented to person, place, and time. He appears well-developed and well-nourished. No distress.  HENT:  Head: Normocephalic and atraumatic.  Right Ear: Hearing normal.  Left Ear: Hearing normal.  Nose: Nose normal.  Mouth/Throat: Oropharynx is clear and moist and mucous membranes are normal.  Eyes: Conjunctivae and EOM are normal. Pupils are equal, round, and reactive to light.  Neck: Normal range of motion. Neck supple.  Cardiovascular: Regular rhythm, S1 normal and S2 normal.  Exam reveals no gallop and no friction rub.   No murmur heard. Pulmonary/Chest: Effort normal and breath sounds normal. No respiratory distress. He exhibits no tenderness.  Abdominal: Soft. Normal appearance and bowel sounds are normal. There is no hepatosplenomegaly. There is generalized tenderness. There is no rebound, no guarding, no tenderness at McBurney's point and negative Murphy's sign. No hernia.  Musculoskeletal: Normal range of motion.  Neurological: He is alert and oriented to person,  place, and time. He has normal strength. No cranial nerve deficit or sensory deficit. Coordination normal. GCS eye subscore is 4. GCS verbal subscore is 5. GCS motor subscore is 6.  Skin: Skin is warm, dry and intact. No rash noted. No cyanosis.  Psychiatric: He has a normal mood and affect. His speech is normal and behavior is normal. Thought content normal.    ED Course  Procedures (including critical care time) Labs Review Labs Reviewed  CBC WITH DIFFERENTIAL - Abnormal; Notable for the following:    WBC 12.1 (*)    Neutrophils Relative % 23 (*)    Lymphocytes Relative 64 (*)    Basophils  Relative 5 (*)    Lymphs Abs 7.7 (*)    Basophils Absolute 0.6 (*)    All other components within normal limits  COMPREHENSIVE METABOLIC PANEL - Abnormal; Notable for the following:    Sodium 133 (*)    Chloride 93 (*)    Glucose, Bld 126 (*)    Albumin 3.3 (*)    AST 53 (*)    ALT 76 (*)    GFR calc non Af Amer 90 (*)    All other components within normal limits  URINALYSIS, ROUTINE W REFLEX MICROSCOPIC - Abnormal; Notable for the following:    Color, Urine AMBER (*)    pH 8.5 (*)    Bilirubin Urine SMALL (*)    Protein, ur 30 (*)    All other components within normal limits  LIPASE, BLOOD  PROTIME-INR  URINE MICROSCOPIC-ADD ON  PATHOLOGIST SMEAR REVIEW   Imaging Review Dg Abd Acute W/chest  02/06/2013   *RADIOLOGY REPORT*  Clinical Data: Abdominal pain with nausea and vomiting.  ACUTE ABDOMEN SERIES (ABDOMEN 2 VIEW & CHEST 1 VIEW)  Comparison: 02/03/2013  Findings: There is chronic peribronchial thickening on the right, unchanged.  The heart size and vascularity are normal.  No free air or free fluid in the abdomen.  Bowel gas pattern is normal.  No osseous abnormality.  IMPRESSION: Benign-appearing abdomen and chest.   Original Report Authenticated By: Francene Boyers, M.D.    MDM  Diagnosis: 1. Gastritis 2. Upper GI bleed  Presents to the ER for evaluation of continued abdominal pain. Patient was seen several days ago for same and diagnosed with gastritis secondary to alcohol intake. Patient has had persistent abdominal pain with nausea and vomiting. He has had episodes of vomiting small amounts of blood.  Vital signs are stable. Patient's hemoglobin is normal. Examination reveals diffuse tenderness. Lab work shows slight leukocytosis, otherwise no significant abnormalities.  Based on the patient's persistent symptoms, CT scan was performed. CT scan was negative. Patient's initial diagnosis of alcoholic gastritis is felt to be the correct diagnosis. He has been vomiting mostly  clear mucus with some flakes of blood in it but no evidence of any ongoing active bleeding. Vital signs were stable and hemoglobin was normal and therefore it is felt appropriate for him to have outpatient workup with GI. He was counseled to return to the ER for worsening symptoms including vomiting gross blood.  Gilda Crease, MD 02/09/13 [phone]

## 2013-02-06 NOTE — ED Notes (Signed)
Pt reports severe ab pain that started 1 week ago, was seen in the ed for same on Sunday, cont. To have pain "in the middle", nausea and vomiting, stated "blood in it", no fever or diarrhea

## 2013-03-06 ENCOUNTER — Other Ambulatory Visit: Payer: Self-pay | Admitting: Gastroenterology

## 2013-03-06 ENCOUNTER — Ambulatory Visit (INDEPENDENT_AMBULATORY_CARE_PROVIDER_SITE_OTHER): Payer: Self-pay | Admitting: Gastroenterology

## 2013-03-06 ENCOUNTER — Encounter: Payer: Self-pay | Admitting: Gastroenterology

## 2013-03-06 VITALS — BP 120/65 | HR 86 | Temp 97.6°F | Ht 71.0 in | Wt 175.8 lb

## 2013-03-06 DIAGNOSIS — K920 Hematemesis: Secondary | ICD-10-CM

## 2013-03-06 DIAGNOSIS — R7989 Other specified abnormal findings of blood chemistry: Secondary | ICD-10-CM

## 2013-03-06 NOTE — Patient Instructions (Addendum)
Start taking Nexium each morning 30 minutes before breakfast.   We have set you up for an upper endoscopy with Dr. Darrick Penna to further evaluate your stomach.   Great job on avoiding alcohol! Keep up the good work!  We will recheck your liver numbers in 4-6 weeks.

## 2013-03-06 NOTE — Assessment & Plan Note (Signed)
37 year old male with several prior hospital admissions secondary to epigastric pain, persistent nausea and vomiting, and associated low-volume hematemesis, likely secondary to ETOH gastritis. He has abstained from alcohol for at least 6 weeks with overall improvement in symptoms. Reported melena several weeks ago, but Hgb is stable. No PPI.  Likely ETOH gastritis, possible PUD. Needs upper GI evaluation in near future for further assessment. Recommend starting Nexium daily; I have provided samples. Continue avoidance of ETOH. As patient smoke marijuana daily, he will need sedation with Propofol.  Proceed with upper endoscopy in the near future with Dr. Darrick Penna. The risks, benefits, and alternatives have been discussed in detail with patient. They have stated understanding and desire to proceed.

## 2013-03-06 NOTE — Progress Notes (Signed)
Referring Provider: ED  Primary Care Physician:  No PCP Per Patient Primary Gastroenterologist:  Dr. Darrick Penna    Chief Complaint  Patient presents with  . Rectal Bleeding    HPI:   [name] presents today at the request of the ED secondary to hematemesis. He has presented to the ED 6 separate occasions this year, most recently Sept 3 due to abdominal pain. History of ETOH use and thought to have ETOH gastritis. CT early September with chronic mild enlargement of the appendix but no inflammatory changes, left kidney stone, no acute findings. Mild elevation of transaminases noted on past several occasions.   Acute onset of N/V about 3 weeks ago. Couldn't eat, couldn't hold down food or liquids. Stomach in a lot of pain, balled up in a knot. Located epigastric region. Intermittent episodes. Comes out of the blue. Notes streaky blood in emesis. Coffee-ground emesis, tarry-looking. Notes 2-week history of melena as well, runny early September. Hasn't seen any recently. No hematochezia. Appetite better now since eating baked foods, trying to change behaviors. Stomach used to growl loudly each morning.  Lost about 15 lbs during last episode in September 2014. During episode had several days of constipation but now back on track.   Rare NSAIDs, only when tooth hurts. No ETOH in about 6 weeks. Prior to this drank heavy ETOH daily, moonshine, beer, etc. All day long.    No past medical history on file.  Past Surgical History  Procedure Laterality Date  . None      Current Outpatient Prescriptions  Medication Sig Dispense Refill  . ranitidine (ZANTAC) 150 MG tablet Take 1 tablet (150 mg total) by mouth 2 (two) times daily.  60 tablet  0   No current facility-administered medications for this visit.    Allergies as of 03/06/2013 - Review Complete 03/06/2013  Allergen Reaction Noted  . Contrast media [iodinated diagnostic agents] Hives and Itching 06/05/2012    Family History  Problem  Relation Age of Onset  . Colon cancer Neg Hx     History   Social History  . Marital Status: Single    Spouse Name: N/A    Number of Children: 3  . Years of Education: N/A   Occupational History  .     Social History Main Topics  . Smoking status: Current Every Day Smoker    Types: Cigarettes  . Smokeless tobacco: Not on file  . Alcohol Use: Yes     Comment: Hx of ETOH abuse, none in about 6 weeks as of 10/1.   Marland Kitchen Drug Use: Yes    Special: Marijuana     Comment: couple times a day  . Sexual Activity: Not on file   Other Topics Concern  . Not on file   Social History Narrative  . No narrative on file    Review of Systems: Gen: Denies any fever, chills, loss of appetite, fatigue, weight loss. CV: Denies chest pain, heart palpitations, syncope, peripheral edema. Resp: +wheezing GI: Denies dysphagia or odynophagia. Denies hematemesis, fecal incontinence, or jaundice.  GU : Denies urinary burning, urinary frequency, urinary incontinence.  MS: Denies joint pain, muscle weakness, cramps, limited movement Derm: Denies rash, itching, dry skin Psych: Denies depression, anxiety, confusion or memory loss  Heme: Denies bruising, bleeding, and enlarged lymph nodes.  Physical Exam: BP 120/65  Pulse 86  Temp(Src) 97.6 F (36.4 C) (Oral)  Ht 5\' 11"  (1.803 m)  Wt 175 lb 12.8 oz (79.742 kg)  BMI 24.53  kg/m2 General:   Alert and oriented. Well-developed, well-nourished, pleasant and cooperative. Head:  Normocephalic and atraumatic. Eyes:  Conjunctiva pink, sclera clear, no icterus.   Conjunctiva pink. Ears:  Normal auditory acuity. Nose:  No deformity, discharge,  or lesions. Mouth:  No deformity or lesions, mucosa pink and moist.  Neck:  Supple, without mass or thyromegaly. Lungs:  Clear to auscultation bilaterally, without wheezing, rales, or rhonchi.  Heart:  S1, S2 present without murmurs noted.  Abdomen:  +BS, soft, TTP epigastric region and non-distended. Without mass or  HSM. No rebound or guarding. No hernias noted. Rectal:  Deferred  Msk:  Symmetrical without gross deformities. Normal posture. Extremities:  Without clubbing or edema. Neurologic:  Alert and  oriented x4;  grossly normal neurologically. Skin:  Intact, warm and dry without significant lesions or rashes Cervical Nodes:  No significant cervical adenopathy. Psych:  Alert and cooperative. Normal mood and affect.  Lab Results  Component Value Date   WBC 12.1* 02/06/2013   HGB 15.3 02/06/2013   HCT 43.2 02/06/2013   MCV 90.6 02/06/2013   PLT 220 02/06/2013   Lab Results  Component Value Date   ALT 76* 02/06/2013   AST 53* 02/06/2013   ALKPHOS 86 02/06/2013   BILITOT 0.5 02/06/2013   Lab Results  Component Value Date   LIPASE 21 02/06/2013

## 2013-03-06 NOTE — Assessment & Plan Note (Signed)
Mild elevation of transaminases in the setting of ongoing ETOH abuse; will recheck in 4-6 weeks, as patient has been abstaining from ETOH since last blood draw. I expect this will have significantly improved if not resolved. If continued elevation, recommend viral markers and further work-up as indicated.

## 2013-03-07 ENCOUNTER — Encounter (HOSPITAL_COMMUNITY): Payer: Self-pay | Admitting: Pharmacy Technician

## 2013-03-07 NOTE — Progress Notes (Signed)
NO PCP

## 2013-03-13 NOTE — Patient Instructions (Signed)
[name]  03/13/2013   Your procedure is scheduled on:  03/19/13  Report to [facility] at 0700 AM.  Call this number if you have problems the morning of surgery: [phone]   Remember:   Do not eat food or drink liquids after midnight.   Take these medicines the morning of surgery with A SIP OF WATER: zantac   Do not wear jewelry, make-up or nail polish.  Do not wear lotions, powders, or perfumes. You may wear deodorant.  Do not shave 48 hours prior to surgery. Men may shave face and neck.  Do not bring valuables to the hospital.  [facility] is not responsible                  for any belongings or valuables.               Contacts, dentures or bridgework may not be worn into surgery.  Leave suitcase in the car. After surgery it may be brought to your room.  For patients admitted to the hospital, discharge time is determined by your                treatment team.               Patients discharged the day of surgery will not be allowed to drive  home.  Name and phone number of your driver: family  Special Instructions: N/A   Please read over the following fact sheets that you were given: Anesthesia Post-op Instructions and Care and Recovery After Surgery   PATIENT INSTRUCTIONS POST-ANESTHESIA  IMMEDIATELY FOLLOWING SURGERY:  Do not drive or operate machinery for the first twenty four hours after surgery.  Do not make any important decisions for twenty four hours after surgery or while taking narcotic pain medications or sedatives.  If you develop intractable nausea and vomiting or a severe headache please notify your doctor immediately.  FOLLOW-UP:  Please make an appointment with your surgeon as instructed. You do not need to follow up with anesthesia unless specifically instructed to do so.  WOUND CARE INSTRUCTIONS (if applicable):  Keep a dry clean dressing on the anesthesia/puncture wound site if there is drainage.  Once the wound has quit draining you may leave it open  to air.  Generally you should leave the bandage intact for twenty four hours unless there is drainage.  If the epidural site drains for more than 36-48 hours please call the anesthesia department.  QUESTIONS?:  Please feel free to call your physician or the hospital operator if you have any questions, and they will be happy to assist you.      Esophagogastroduodenoscopy Esophagogastroduodenoscopy (EGD) is a procedure to examine the lining of the esophagus, stomach, and first part of the small intestine (duodenum). A long, flexible, lighted tube with a camera attached (endoscope) is inserted down the throat to view these organs. This procedure is done to detect problems or abnormalities, such as inflammation, bleeding, ulcers, or growths, in order to treat them. The procedure lasts about 5 20 minutes. It is usually an outpatient procedure, but it may need to be performed in emergency cases in the hospital. LET YOUR CAREGIVER KNOW ABOUT:   Allergies to food or medicine.  All medicines you are taking, including vitamins, herbs, eyedrops, and over-the-counter medicines and creams.  Use of steroids (by mouth or creams).  Previous problems you or members of your family have had with the use of anesthetics.  Any blood  disorders you have.  Previous surgeries you have had.  Other health problems you have.  Possibility of pregnancy, if this applies. RISKS AND COMPLICATIONS  Generally, EGD is a safe procedure. However, as with any procedure, complications can occur. Possible complications include:  Infection.  Bleeding.  Tearing (perforation) of the esophagus, stomach, or duodenum.  Difficulty breathing or not being able to breath.  Excessive sweating.  Spasms of the larynx.  Slowed heartbeat.  Low blood pressure. BEFORE THE PROCEDURE  Do not eat or drink anything for 6 8 hours before the procedure or as directed by your caregiver.  Ask your caregiver about changing or stopping  your regular medicines.  If you wear dentures, be prepared to remove them before the procedure.  Arrange for someone to drive you home after the procedure. PROCEDURE   A vein will be accessed to give medicines and fluids. A medicine to relax you (sedative) and a pain reliever will be given through that access into the vein.  A numbing medicine (local anesthetic) may be sprayed on your throat for comfort and to stop you from gagging or coughing.  A mouth guard may be placed in your mouth to protect your teeth and to keep you from biting on the endoscope.  You will be asked to lie on your left side.  The endoscope is inserted down your throat and into the esophagus, stomach, and duodenum.  Air is put through the endoscope to allow your caregiver to view the lining of your esophagus clearly.  The esophagus, stomach, and duodenum is then examined. During the exam, your caregiver may:  Remove tissue to be examined under a microscope (biopsy) for inflammation, infection, or other medical problems.  Remove growths.  Remove objects (foreign bodies) that are stuck.  Treat any bleeding with medicines or other devices that stop tissues from bleeding (hot cauters, clipping devices).  Widen (dilate) or stretch narrowed areas of the esophagus and stomach.  The endoscope will then be withdrawn. AFTER THE PROCEDURE  You will be taken to a recovery area to be monitored. You will be able to go home once you are stable and alert.  Do not eat or drink anything until the local anesthetic and numbing medicines have worn off. You may choke.  It is normal to feel bloated, have pain with swallowing, or have a sore throat for a short time. This will wear off.  Your caregiver should be able to discuss his or her findings with you. It will take longer to discuss the test results if any biopsies were taken. Document Released: 09/23/2004 Document Revised: 05/09/2012 Document Reviewed:  04/25/2012 [facility] Patient Information 2014 [place], [place].

## 2013-03-14 ENCOUNTER — Encounter (HOSPITAL_COMMUNITY): Payer: Self-pay

## 2013-03-14 ENCOUNTER — Encounter (HOSPITAL_COMMUNITY)
Admission: RE | Admit: 2013-03-14 | Discharge: 2013-03-14 | Disposition: A | Discharge status: Home / Self Care | Payer: Self-pay | Source: Ambulatory Visit | Attending: Gastroenterology | Admitting: Gastroenterology

## 2013-03-14 DIAGNOSIS — Z01812 Encounter for preprocedural laboratory examination: Secondary | ICD-10-CM | POA: Insufficient documentation

## 2013-03-14 HISTORY — DX: Gastro-esophageal reflux disease without esophagitis: K21.9

## 2013-03-14 LAB — BASIC METABOLIC PANEL
BUN: 6 mg/dL (ref 6–23)
CO2: 25 mEq/L (ref 19–32)
Chloride: 102 mEq/L (ref 96–112)
GFR calc non Af Amer: >90 mL/min (ref >90)
Glucose, Bld: 111 mg/dL — ABNORMAL HIGH (ref 70–99)
Potassium: 3.7 mEq/L (ref 3.5–5.1)
Sodium: 137 mEq/L (ref 135–145)

## 2013-03-19 ENCOUNTER — Ambulatory Visit (HOSPITAL_COMMUNITY): Payer: Self-pay | Admitting: Anesthesiology

## 2013-03-19 ENCOUNTER — Encounter (HOSPITAL_COMMUNITY)
Admission: RE | Disposition: A | Discharge status: Home / Self Care | Payer: Self-pay | Source: Ambulatory Visit | Attending: Gastroenterology

## 2013-03-19 ENCOUNTER — Ambulatory Visit (HOSPITAL_COMMUNITY)
Admission: RE | Admit: 2013-03-19 | Discharge: 2013-03-19 | Disposition: A | Discharge status: Home / Self Care | Payer: Self-pay | Source: Ambulatory Visit | Attending: Gastroenterology | Admitting: Gastroenterology

## 2013-03-19 ENCOUNTER — Encounter (HOSPITAL_COMMUNITY): Payer: Self-pay | Admitting: Anesthesiology

## 2013-03-19 ENCOUNTER — Encounter (HOSPITAL_COMMUNITY): Payer: Self-pay | Admitting: *Deleted

## 2013-03-19 DIAGNOSIS — A048 Other specified bacterial intestinal infections: Secondary | ICD-10-CM | POA: Insufficient documentation

## 2013-03-19 DIAGNOSIS — K259 Gastric ulcer, unspecified as acute or chronic, without hemorrhage or perforation: Secondary | ICD-10-CM

## 2013-03-19 DIAGNOSIS — K2980 Duodenitis without bleeding: Secondary | ICD-10-CM | POA: Insufficient documentation

## 2013-03-19 DIAGNOSIS — K2940 Chronic atrophic gastritis without bleeding: Secondary | ICD-10-CM | POA: Insufficient documentation

## 2013-03-19 DIAGNOSIS — K2960 Other gastritis without bleeding: Secondary | ICD-10-CM

## 2013-03-19 DIAGNOSIS — K920 Hematemesis: Secondary | ICD-10-CM

## 2013-03-19 DIAGNOSIS — K449 Diaphragmatic hernia without obstruction or gangrene: Secondary | ICD-10-CM | POA: Insufficient documentation

## 2013-03-19 HISTORY — PX: ESOPHAGOGASTRODUODENOSCOPY (EGD) WITH PROPOFOL: SHX5813

## 2013-03-19 HISTORY — PX: BIOPSY: SHX5522

## 2013-03-19 SURGERY — ESOPHAGOGASTRODUODENOSCOPY (EGD) WITH PROPOFOL
Anesthesia: Monitor Anesthesia Care

## 2013-03-19 MED ORDER — PROPOFOL 10 MG/ML IV BOLUS
INTRAVENOUS | Status: AC
  Filled 2013-03-19: qty 20

## 2013-03-19 MED ORDER — FENTANYL CITRATE 0.05 MG/ML IJ SOLN: 25.0000 ug | INTRAMUSCULAR | Status: DC | PRN

## 2013-03-19 MED ORDER — FENTANYL CITRATE 0.05 MG/ML IJ SOLN
INTRAMUSCULAR | Status: AC
  Filled 2013-03-19: qty 2

## 2013-03-19 MED ORDER — LIDOCAINE HCL (PF) 1 % IJ SOLN
INTRAMUSCULAR | Status: AC
  Filled 2013-03-19: qty 5

## 2013-03-19 MED ORDER — LACTATED RINGERS IV SOLN
INTRAVENOUS | Status: DC
  Administered 2013-03-19: 08:00:00 via INTRAVENOUS

## 2013-03-19 MED ORDER — PROPOFOL 10 MG/ML IV BOLUS
INTRAVENOUS | Status: DC | PRN
  Administered 2013-03-19 (×3): 8 mg via INTRAVENOUS

## 2013-03-19 MED ORDER — ONDANSETRON HCL 4 MG/2ML IJ SOLN: 4.0000 mg | Freq: Once | INTRAMUSCULAR | Status: DC | PRN

## 2013-03-19 MED ORDER — FENTANYL CITRATE 0.05 MG/ML IJ SOLN
INTRAMUSCULAR | Status: DC | PRN
  Administered 2013-03-19 (×2): 25 ug via INTRAVENOUS

## 2013-03-19 MED ORDER — PROPOFOL INFUSION 10 MG/ML OPTIME
INTRAVENOUS | Status: DC | PRN
  Administered 2013-03-19: 09:00:00 via INTRAVENOUS
  Administered 2013-03-19: 200 ug/kg/min via INTRAVENOUS

## 2013-03-19 MED ORDER — MIDAZOLAM HCL 2 MG/2ML IJ SOLN
1.0000 mg | INTRAMUSCULAR | Status: DC | PRN
  Administered 2013-03-19: 2 mg via INTRAVENOUS

## 2013-03-19 MED ORDER — MIDAZOLAM HCL 2 MG/2ML IJ SOLN
INTRAMUSCULAR | Status: AC
  Filled 2013-03-19: qty 2

## 2013-03-19 MED ORDER — FENTANYL CITRATE 0.05 MG/ML IJ SOLN
25.0000 ug | INTRAMUSCULAR | Status: AC
  Administered 2013-03-19 (×2): 25 ug via INTRAVENOUS

## 2013-03-19 MED ORDER — OMEPRAZOLE 20 MG PO CPDR: DELAYED_RELEASE_CAPSULE | ORAL | Status: DC

## 2013-03-19 MED ORDER — STERILE WATER FOR IRRIGATION IR SOLN
Status: DC | PRN
  Administered 2013-03-19: 09:00:00

## 2013-03-19 MED ORDER — BUTAMBEN-TETRACAINE-BENZOCAINE 2-2-14 % EX AERO
1.0000 | INHALATION_SPRAY | Freq: Once | CUTANEOUS | Status: AC
  Administered 2013-03-19: 1 via TOPICAL
  Filled 2013-03-19: qty 56

## 2013-03-19 SURGICAL SUPPLY — 19 items
BLOCK BITE 60FR ADLT L/F BLUE (MISCELLANEOUS) ×1 IMPLANT
ELECT REM PT RETURN 9FT ADLT (ELECTROSURGICAL)
ELECTRODE REM PT RTRN 9FT ADLT (ELECTROSURGICAL) IMPLANT
FLOOR PAD 36X40 (MISCELLANEOUS) ×2
FORCEPS BIOP RAD 4 LRG CAP 4 (CUTTING FORCEPS) ×1 IMPLANT
FORMALIN 10 PREFIL 20ML (MISCELLANEOUS) IMPLANT
MANIFOLD NEPTUNE II (INSTRUMENTS) ×2 IMPLANT
NDL SCLEROTHERAPY 25GX240 (NEEDLE) IMPLANT
NEEDLE SCLEROTHERAPY 25GX240 (NEEDLE) IMPLANT
PAD FLOOR 36X40 (MISCELLANEOUS) IMPLANT
PROBE APC STR FIRE (PROBE) IMPLANT
PROBE INJECTION GOLD (MISCELLANEOUS)
PROBE INJECTION GOLD 7FR (MISCELLANEOUS) IMPLANT
SNARE SHORT THROW 13M SML OVAL (MISCELLANEOUS) IMPLANT
SPONGE GAUZE 4X4 12PLY (GAUZE/BANDAGES/DRESSINGS) ×1 IMPLANT
SYR 50ML LL SCALE MARK (SYRINGE) ×1 IMPLANT
TUBING ENDO SMARTCAP PENTAX (MISCELLANEOUS) IMPLANT
TUBING IRRIGATION ENDOGATOR (MISCELLANEOUS) ×1 IMPLANT
WATER STERILE IRR 1000ML POUR (IV SOLUTION) ×1 IMPLANT

## 2013-03-19 NOTE — Progress Notes (Signed)
Wants coke to drink. Coke given to pt to drink. States "I said I wanted 2 cokes to drink." 2 cans of coke provided for pt.

## 2013-03-19 NOTE — Anesthesia Procedure Notes (Signed)
Procedure Name: MAC Date/Time: 03/19/2013 8:51 AM Performed by: Franco Nones Pre-anesthesia Checklist: Patient identified, Emergency Drugs available, Suction available, Timeout performed and Patient being monitored Patient Re-evaluated:Patient Re-evaluated prior to inductionOxygen Delivery Method: Non-rebreather mask

## 2013-03-19 NOTE — Progress Notes (Signed)
"  Do you have any "wheels" for me. I am not walking out of here." Wheel chair provided and wheeled to car as requested.

## 2013-03-19 NOTE — H&P (Signed)
  Primary Care Physician:  No PCP Per Patient Primary Gastroenterologist:  Dr. Darrick Penna  Pre-Procedure History & Physical: HPI:  [name] is a 37 y.o. male here for HEMATEMESIS.  Past Medical History  Diagnosis Date  . GERD (gastroesophageal reflux disease)     Past Surgical History  Procedure Laterality Date  . None      Prior to Admission medications   Medication Sig Start Date End Date Taking? Authorizing Provider  ranitidine (ZANTAC) 150 MG tablet Take 1 tablet (150 mg total) by mouth 2 (two) times daily. 02/06/13   Gilda Crease, MD    Allergies as of 03/06/2013 - Review Complete 03/06/2013  Allergen Reaction Noted  . Contrast media [iodinated diagnostic agents] Hives and Itching 06/05/2012    Family History  Problem Relation Age of Onset  . Colon cancer Neg Hx     History   Social History  . Marital Status: Single    Spouse Name: N/A    Number of Children: 3  . Years of Education: N/A   Occupational History  .     Social History Main Topics  . Smoking status: Current Every Day Smoker -- 1.00 packs/day for 12 years    Types: Cigarettes  . Smokeless tobacco: Not on file  . Alcohol Use: Yes     Comment: Hx of ETOH abuse, none in about 6 weeks as of 10/1.   Marland Kitchen Drug Use: Yes    Special: Marijuana     Comment: couple times a day-last time 03/13/2013  . Sexual Activity: Yes    Birth Control/ Protection: None   Other Topics Concern  . Not on file   Social History Narrative  . No narrative on file    Review of Systems: See HPI, otherwise negative ROS   Physical Exam: BP 110/65  Pulse 74  Temp(Src) 98.2 F (36.8 C) (Oral)  Resp 18  SpO2 95% General:   Alert,  pleasant and cooperative in NAD Head:  Normocephalic and atraumatic. Neck:  Supple; Lungs:  Clear throughout to auscultation.    Heart:  Regular rate and rhythm. Abdomen:  Soft, nontender and nondistended. Normal bowel sounds, without guarding, and without rebound.   Neurologic:   Alert and  oriented x4;  grossly normal neurologically.  Impression/Plan:     HEMATEMESIS  PLAN:  EGD TODAY

## 2013-03-19 NOTE — Transfer of Care (Signed)
Immediate Anesthesia Transfer of Care Note  Patient: [name]  Procedure(s) Performed: Procedure(s) (LRB): ESOPHAGOGASTRODUODENOSCOPY (EGD) WITH PROPOFOL (N/A) Gastric Biopsies (N/A)  Patient Location: PACU  Anesthesia Type: MAC  Level of Consciousness: awake  Airway & Oxygen Therapy: Patient Spontanous Breathing. Nasal cannula  Post-op Assessment: Report given to PACU RN, Post -op Vital signs reviewed and stable and Patient moving all extremities  Post vital signs: Reviewed and stable  Complications: No apparent anesthesia complications

## 2013-03-19 NOTE — Anesthesia Postprocedure Evaluation (Signed)
Anesthesia Post Note  Patient: [name]  Procedure(s) Performed: Procedure(s) (LRB): ESOPHAGOGASTRODUODENOSCOPY (EGD) WITH PROPOFOL (N/A) Gastric Biopsies (N/A)  Anesthesia type: MAC  Patient location: PACU  Post pain: Pain level controlled  Post assessment: Post-op Vital signs reviewed, Patient's Cardiovascular Status Stable, Respiratory Function Stable, Patent Airway, No signs of Nausea or vomiting and Pain level controlled  Last Vitals:  Filed Vitals:   03/19/13 0925  BP: 95/48  Pulse: 64  Temp: 36.5 C  Resp: 20    Post vital signs: Reviewed and stable  Level of consciousness: awake and alert   Complications: No apparent anesthesia complications

## 2013-03-19 NOTE — Op Note (Signed)
[facility] [address] [place], [address]   ENDOSCOPY PROCEDURE REPORT  PATIENT: [name], [name]  MR#: [id number] BIRTHDATE: February 05, 1976 , 37  yrs. old GENDER: Male  ENDOSCOPIST: Jonette Eva, MD REFERRED BY:   Devoria Albe, MD  PROCEDURE DATE: 03/19/2013 PROCEDURE:   EGD w/ biopsy  INDICATIONS:Hematemesis. PT DENIES ASA/NSAID USE. LAST ETOH 2 MOS AGO. LAST SAW BLOOD IN VOMIT 3 WEEKS AGO. MEDICATIONS: MAC sedation, administered by CRNA TOPICAL ANESTHETIC:   Cetacaine Spray  DESCRIPTION OF PROCEDURE:     Physical exam was performed.  Informed consent was obtained from the patient after explaining the benefits, risks, and alternatives to the procedure.  The patient was connected to the monitor and placed in the left lateral position.  Continuous oxygen was provided by nasal cannula and IV medicine administered through an indwelling cannula.  After administration of sedation, the patients esophagus was intubated and the     endoscope was advanced under direct visualization to the second portion of the duodenum.  The scope was removed slowly by carefully examining the color, texture, anatomy, and integrity of the mucosa on the way out.  The patient was recovered in endoscopy and discharged home in satisfactory condition.   ESOPHAGUS: The mucosa of the esophagus appeared normal.   A small hiatal hernia was noted.   STOMACH: Two medium sized non-bleeding irregular shaped, linear and clean-based ulcers were found in the gastric antrum.  Biopsies were taken at edge of the ulcer(6) and at the center of the ulcer(2). LINEAR ULCER NOT BIOPSIED.  Moderate erosive gastritis (inflammation) was found in the gastric antrum and gastric body.  Multiple biopsies were performed using cold forceps.   DUODENUM: Mild duodenal inflammation was found in the duodenal bulb.   The duodenal mucosa showed no abnormalities in the 2nd part of the duodenum.  COMPLICATIONS:    None  ENDOSCOPIC IMPRESSION: 1.   HEMATEMESIS DUE TO PUD MOST LIKELY DUE TO H PYLORI INFECTION 2.   Small hiatal hernia 3.   Two non-bleeding ulcers  in the gastric antrum 4.   MODERATE Erosive gastritis LIKELY DUE TO ETOH 5.   MILD DUODENITIS LIKLEY DUE TO ETOHDuodenal inflammation was found in the duodenal bulb  RECOMMENDATIONS: STRICTLY AVOID ASPIRIN, BC/GOODY POWDERS, IBUPROFEN/MOTRIN, OR NAPROXEN/ALEVE FOR 2 WEEKS. STOP ZANTAC.  USE AS NEEDED FOR ABDOMINAL PAIN. START OMEPRAZOLE.  TAKE 30 MINUTES PRIOR TO YOUR MEALS TWICE DAILY FOR 3 MOS THE ONCE DAILY. FOLLOW A LOW FAT DIET. BIOPSIES SHOULD BE BACK IN 7 DAYS. Repeat EGD WITH PROPOFOL IN 3 MOS TO ASSESS HEALING of your ulcers.  FOLLOW UP IN 6 MOS.   REPEAT EXAM:   _______________________________ Rosalie DoctorJonette Eva, MD 03/19/2013 3:20 PM       PATIENT NAME:  [name], [name] MR#: [id number]

## 2013-03-19 NOTE — Anesthesia Preprocedure Evaluation (Signed)
Anesthesia Evaluation  Patient identified by MRN, date of birth, ID band Patient awake  General Assessment Comment:Pt not cooperative with interview.  Reviewed: Allergy & Precautions, H&P , NPO status , Patient's Chart, lab work & pertinent test results  Airway Mallampati: II TM Distance: >3 FB Neck ROM: Full    Dental  (+) Teeth Intact   Pulmonary neg pulmonary ROS, Current Smoker,    Pulmonary exam normal       Cardiovascular negative cardio ROS  Rhythm:Regular Rate:Normal     Neuro/Psych    GI/Hepatic GERD-  Medicated,  Endo/Other    Renal/GU      Musculoskeletal   Abdominal   Peds  Hematology   Anesthesia Other Findings   Reproductive/Obstetrics                           Anesthesia Physical Anesthesia Plan  ASA: II  Anesthesia Plan: MAC   Post-op Pain Management:    Induction: Intravenous  Airway Management Planned: Simple Face Mask  Additional Equipment:   Intra-op Plan:   Post-operative Plan:   Informed Consent: I have reviewed the patients History and Physical, chart, labs and discussed the procedure including the risks, benefits and alternatives for the proposed anesthesia with the patient or authorized representative who has indicated his/her understanding and acceptance.     Plan Discussed with:   Anesthesia Plan Comments:         Anesthesia Quick Evaluation

## 2013-03-19 NOTE — Progress Notes (Signed)
From PACU. Awake. Swallowing without difficulty. Girlfriend at side. Dr Darrick Penna in to talk with pt. Cleared for d/c to room.

## 2013-03-21 ENCOUNTER — Telehealth: Payer: Self-pay | Admitting: Gastroenterology

## 2013-03-21 ENCOUNTER — Encounter (HOSPITAL_COMMUNITY): Payer: Self-pay | Admitting: Gastroenterology

## 2013-03-21 NOTE — Telephone Encounter (Signed)
PLEASE CALL PT.  PLEASE CALL PT. HIS stomach Bx showed H. Pylori infection. He needs AMOXICILLIN 500 mg 2 po BID for 10 days and Biaxin 500 mg po bid for 10 days. CONTINUE OMEPRAZOLE BID. Med side effects include NVD, abd pain, and metallic taste. YOU NEED TO COMPLETE THE ABX AND FOLLOW UP FOR YOUR EGD. IF H PYLORI IS NOT CURED IT CAN LEAD TO STOMACH CANCER.  STRICTLY AVOID ASPIRIN, BC/GOODY POWDERS, IBUPROFEN/MOTRIN, OR NAPROXEN/ALEVE UNTIL OCT 29 BECAUSE YOU HAVE A HISTORY OF ULCERS.  STOP ZANTAC. USE AS NEEDED FOR ABDOMINAL PAIN.  FOLLOW A LOW FAT DIET.  Repeat EGD WITH PROPOFOL IN 3 MOS TO ASSESS HEALING of your ulcers.  FOLLOW UP IN 6 MOS E15 HEMATEMESIS.

## 2013-03-22 NOTE — Telephone Encounter (Signed)
LMOM to call.

## 2013-03-25 NOTE — Telephone Encounter (Signed)
Called and informed pt. Called Rx's to [facility] in [place].

## 2013-03-26 NOTE — Telephone Encounter (Signed)
Reminder in epic °

## 2013-04-02 ENCOUNTER — Other Ambulatory Visit: Payer: Self-pay

## 2013-06-10 ENCOUNTER — Encounter (HOSPITAL_COMMUNITY): Payer: Self-pay | Admitting: Emergency Medicine

## 2013-06-10 ENCOUNTER — Emergency Department (HOSPITAL_COMMUNITY)
Admission: EM | Admit: 2013-06-10 | Discharge: 2013-06-10 | Disposition: A | Discharge status: Home / Self Care | Payer: Self-pay | Attending: Emergency Medicine | Admitting: Emergency Medicine

## 2013-06-10 DIAGNOSIS — L723 Sebaceous cyst: Secondary | ICD-10-CM | POA: Insufficient documentation

## 2013-06-10 DIAGNOSIS — K219 Gastro-esophageal reflux disease without esophagitis: Secondary | ICD-10-CM | POA: Insufficient documentation

## 2013-06-10 DIAGNOSIS — F17200 Nicotine dependence, unspecified, uncomplicated: Secondary | ICD-10-CM | POA: Insufficient documentation

## 2013-06-10 DIAGNOSIS — Z79899 Other long term (current) drug therapy: Secondary | ICD-10-CM | POA: Insufficient documentation

## 2013-06-10 MED ORDER — LIDOCAINE HCL (PF) 2 % IJ SOLN
10.0000 mL | Freq: Once | INTRAMUSCULAR | Status: AC
  Administered 2013-06-10: 10 mL via INTRADERMAL
  Filled 2013-06-10: qty 10

## 2013-06-10 MED ORDER — OXYCODONE-ACETAMINOPHEN 5-325 MG PO TABS: 1.0000 | ORAL_TABLET | ORAL | Status: DC | PRN

## 2013-06-10 MED ORDER — OXYCODONE-ACETAMINOPHEN 5-325 MG PO TABS
1.0000 | ORAL_TABLET | Freq: Once | ORAL | Status: AC
  Administered 2013-06-10: 1 via ORAL
  Filled 2013-06-10: qty 1

## 2013-06-10 MED ORDER — POVIDONE-IODINE 10 % EX SOLN
CUTANEOUS | Status: AC
  Administered 2013-06-10: 10:00:00
  Filled 2013-06-10: qty 118

## 2013-06-10 MED ORDER — SULFAMETHOXAZOLE-TRIMETHOPRIM 800-160 MG PO TABS: 1.0000 | ORAL_TABLET | Freq: Two times a day (BID) | ORAL | Status: DC

## 2013-06-10 MED ORDER — SULFAMETHOXAZOLE-TMP DS 800-160 MG PO TABS
1.0000 | ORAL_TABLET | Freq: Once | ORAL | Status: AC
  Administered 2013-06-10: 1 via ORAL
  Filled 2013-06-10: qty 1

## 2013-06-10 NOTE — Discharge Instructions (Signed)
Epidermal Cyst  An epidermal cyst is usually a small, painless lump under the skin. Cysts often occur on the face, neck, stomach, chest, or genitals. The cyst may be filled with a bad smelling paste. Do not pop your cyst. Popping the cyst can cause pain and puffiness (swelling).  HOME CARE   · Only take medicines as told by your doctor.  · Take your medicine (antibiotics) as told. Finish it even if you start to feel better.  GET HELP RIGHT AWAY IF:  · Your cyst is tender, red, or puffy.  · You are not getting better, or you are getting worse.  · You have any questions or concerns.  MAKE SURE YOU:  · Understand these instructions.  · Will watch your condition.  · Will get help right away if you are not doing well or get worse.  Document Released: 06/30/2004 Document Revised: 11/22/2011 Document Reviewed: 11/29/2010  ExitCare® Patient Information ©2014 [employer].

## 2013-06-10 NOTE — ED Provider Notes (Signed)
CSN: [id number]     Arrival date & time 06/10/13  4098 History   First MD Initiated Contact with Patient 06/10/13 0827     Chief Complaint  Patient presents with  . Abscess   (Consider location/radiation/quality/duration/timing/severity/associated sxs/prior Treatment) Patient is a 38 y.o. male presenting with abscess. The history is provided by the patient.  Abscess Location:  Torso Torso abscess location:  Lower back Abscess quality: fluctuance and redness   Abscess quality: not draining, no induration and no itching   Red streaking: no   Duration: "years" Progression:  Worsening Chronicity:  Chronic Context: not diabetes and not injected drug use   Relieved by:  Nothing Worsened by:  Nothing tried Ineffective treatments:  None tried Associated symptoms: no fever, no headaches, no nausea and no vomiting   Risk factors: prior abscess     Patient c/o recurrent abscess to the right lower back.  States that he had same area "cut and drained"  "years ago" at [facility].  He reports having increased pain and redness to the area for several days.  He denies fever, chills or vomiting.    Past Medical History  Diagnosis Date  . GERD (gastroesophageal reflux disease)    Past Surgical History  Procedure Laterality Date  . None    . Esophagogastroduodenoscopy (egd) with propofol N/A 03/19/2013    Procedure: ESOPHAGOGASTRODUODENOSCOPY (EGD) WITH PROPOFOL;  Surgeon: West Bali, MD;  Location: [facility];  Service: Endoscopy;  Laterality: N/A;  . Esophageal biopsy N/A 03/19/2013    Procedure: Gastric Biopsies;  Surgeon: West Bali, MD;  Location: [facility];  Service: Endoscopy;  Laterality: N/A;   Family History  Problem Relation Age of Onset  . Colon cancer Neg Hx   . Kidney failure Father    History  Substance Use Topics  . Smoking status: Current Every Day Smoker -- 0.50 packs/day for 15 years    Types: Cigarettes  . Smokeless tobacco: Never Used  . Alcohol Use: Yes     Comment:  Hx of ETOH abuse, none in about 6 weeks as of 10/1.     Review of Systems  Constitutional: Negative for fever and chills.  Gastrointestinal: Negative for nausea and vomiting.  Musculoskeletal: Negative for arthralgias and joint swelling.  Skin: Positive for color change.       Abscess   Neurological: Negative for headaches.  Hematological: Negative for adenopathy.  All other systems reviewed and are negative.    Allergies  Contrast media  Home Medications   Current Outpatient Rx  Name  Route  Sig  Dispense  Refill  . omeprazole (PRILOSEC) 20 MG capsule      1 po bid for 3 mos then once daily for one year   60 capsule   11    BP 146/86  Pulse 76  Temp(Src) 98.2 F (36.8 C) (Oral)  Resp 20  SpO2 100% Physical Exam  Nursing note and vitals reviewed. Constitutional: He is oriented to person, place, and time. He appears well-developed and well-nourished. No distress.  HENT:  Head: Normocephalic and atraumatic.  Cardiovascular: Normal rate, regular rhythm and normal heart sounds.   No murmur heard. Pulmonary/Chest: Effort normal and breath sounds normal. No respiratory distress.  Neurological: He is alert and oriented to person, place, and time. He exhibits normal muscle tone. Coordination normal.  Skin: Skin is warm and dry.     Large, localized , fluctuant mass to the right lower back.  Small area of erythema .  No red streaking or drainage.      ED Course  Procedures (including critical care time) Labs Review Labs Reviewed - No data to display Imaging Review No results found.  EKG Interpretation   None       MDM    INCISION AND DRAINAGE Performed by: Pauline AusRIPLETT,Jordyn Hofacker L. Consent: Verbal consent obtained. Risks and benefits: risks, benefits and alternatives were discussed Type: abscess  Body area: right lower back Anesthesia: local infiltration  Incision was made with a #11 scalpel.  Local anesthetic: lidocaine 2% w/o epinephrine  Anesthetic  total: 3 ml  Complexity: complex Blunt dissection to break up loculations  Drainage: purulent  Drainage amount: large  Packing material: 1/2 in iodoform gauze  Patient tolerance: Patient tolerated the procedure well with no immediate complications.     Very large epidermal cyst to the right lower back with quarter sized area of fluctuance and erythema.  Advised pt that he will need to f/u with general surgery for further management.  Pt agrees to care plan and to return here in 2 days for packing removal and recheck.  Will prescribe percocet and bactrim.    Patient is feeling better after procedure, ambulates w/o difficulty.  Appears stable for discharge.  Ardell Makarewicz L. Trisha Mangleriplett, PA-C 06/11/13 2136

## 2013-06-10 NOTE — ED Notes (Signed)
Patient c/o multiple abscesses to right lower back. Per patient has had abscess x10 yrs with drainage-[facility]. Per patient abscesses have recently gotten bigger and more painful.

## 2013-06-12 NOTE — ED Provider Notes (Signed)
Medical screening examination/treatment/procedure(s) were performed by non-physician practitioner and as supervising physician I was immediately available for consultation/collaboration.  EKG Interpretation   None         Shaneese Tait L Nefi Musich, MD 06/12/13 1519 

## 2013-06-13 ENCOUNTER — Encounter (HOSPITAL_COMMUNITY): Payer: Self-pay | Admitting: Emergency Medicine

## 2013-06-13 ENCOUNTER — Emergency Department (HOSPITAL_COMMUNITY)
Admission: EM | Admit: 2013-06-13 | Discharge: 2013-06-13 | Disposition: A | Discharge status: Home / Self Care | Payer: Self-pay | Attending: Emergency Medicine | Admitting: Emergency Medicine

## 2013-06-13 DIAGNOSIS — K219 Gastro-esophageal reflux disease without esophagitis: Secondary | ICD-10-CM | POA: Insufficient documentation

## 2013-06-13 DIAGNOSIS — L720 Epidermal cyst: Secondary | ICD-10-CM

## 2013-06-13 DIAGNOSIS — Z79899 Other long term (current) drug therapy: Secondary | ICD-10-CM | POA: Insufficient documentation

## 2013-06-13 DIAGNOSIS — L723 Sebaceous cyst: Secondary | ICD-10-CM | POA: Insufficient documentation

## 2013-06-13 DIAGNOSIS — F17200 Nicotine dependence, unspecified, uncomplicated: Secondary | ICD-10-CM | POA: Insufficient documentation

## 2013-06-13 MED ORDER — OXYCODONE-ACETAMINOPHEN 5-325 MG PO TABS: 1.0000 | ORAL_TABLET | ORAL | Status: DC | PRN

## 2013-06-13 NOTE — ED Provider Notes (Signed)
CSN: [id number]     Arrival date & time 06/13/13  [id number] History   First MD Initiated Contact with Patient 06/13/13 [phone]     Chief Complaint  Patient presents with  . Wound Check   (Consider location/radiation/quality/duration/timing/severity/associated sxs/prior Treatment) HPI Comments: [name] is a 38 y.o. male who presents to the Emergency Department for recheck of an epidermal abscess that was I&D'd three days ago.  He reports continued discomfort of the area and drainage.  He also states that redness has improved.  He denies fever, chills, N/V or increased swelling of the area.  Patient reports having this cyst for greater than 10-15 years with previous I&D years ago.    Patient is a 38 y.o. male presenting with wound check. The history is provided by the patient.  Wound Check Pertinent negatives include no arthralgias, chills, fever, joint swelling, nausea or vomiting.    Past Medical History  Diagnosis Date  . GERD (gastroesophageal reflux disease)    Past Surgical History  Procedure Laterality Date  . None    . Esophagogastroduodenoscopy (egd) with propofol N/A 03/19/2013    Procedure: ESOPHAGOGASTRODUODENOSCOPY (EGD) WITH PROPOFOL;  Surgeon: West BaliSandi L Fields, MD;  Location: [facility];  Service: Endoscopy;  Laterality: N/A;  . Esophageal biopsy N/A 03/19/2013    Procedure: Gastric Biopsies;  Surgeon: West BaliSandi L Fields, MD;  Location: [facility];  Service: Endoscopy;  Laterality: N/A;   Family History  Problem Relation Age of Onset  . Colon cancer Neg Hx   . Kidney failure Father    History  Substance Use Topics  . Smoking status: Current Every Day Smoker -- 0.50 packs/day for 15 years    Types: Cigarettes  . Smokeless tobacco: Never Used  . Alcohol Use: Yes     Comment: Hx of ETOH abuse, none in about 6 weeks as of 10/1.     Review of Systems  Constitutional: Negative for fever and chills.  Gastrointestinal: Negative for nausea and vomiting.  Musculoskeletal: Negative  for arthralgias and joint swelling.  Skin: Positive for color change.       Abscess right lower back  Hematological: Negative for adenopathy.  All other systems reviewed and are negative.    Allergies  Contrast media  Home Medications   Current Outpatient Rx  Name  Route  Sig  Dispense  Refill  . omeprazole (PRILOSEC) 20 MG capsule      1 po bid for 3 mos then once daily for one year   60 capsule   11   . oxyCODONE-acetaminophen (PERCOCET/ROXICET) 5-325 MG per tablet   Oral   Take 1 tablet by mouth every 4 (four) hours as needed for severe pain.   20 tablet   0   . oxyCODONE-acetaminophen (PERCOCET/ROXICET) 5-325 MG per tablet   Oral   Take 1 tablet by mouth every 4 (four) hours as needed for severe pain.   15 tablet   0   . sulfamethoxazole-trimethoprim (SEPTRA DS) 800-160 MG per tablet   Oral   Take 1 tablet by mouth 2 (two) times daily. For 10 days   20 tablet   0    BP 107/64  Pulse 84  Temp(Src) 98.3 F (36.8 C) (Oral)  Resp 16  SpO2 100% Physical Exam  Nursing note and vitals reviewed. Constitutional: He is oriented to person, place, and time. He appears well-developed and well-nourished. No distress.  HENT:  Head: Normocephalic and atraumatic.  Cardiovascular: Normal rate, regular rhythm and normal heart  sounds.   No murmur heard. Pulmonary/Chest: Effort normal and breath sounds normal. No respiratory distress.  Musculoskeletal: Normal range of motion.  Neurological: He is alert and oriented to person, place, and time. He exhibits normal muscle tone. Coordination normal.  Skin: Skin is warm and dry. There is erythema.     Large abscess to the right lower back with packing in place.  Erythema improved from previous visit    ED Course  Procedures (including critical care time) Labs Review Labs Reviewed - No data to display Imaging Review No results found.  EKG Interpretation   None       MDM   1. Epidermal cyst      Patient also  seen by me on the initial visit.  Erythema has greatly improved.   Still draining, and fluctuant.  Given location of the abscess, I have strongly advised patient of the importance of close f/u with general surgery for further management.  Referral info given he is currently taking antibiotics.  I have also advised him to do warm water soaks frequently.  Pt agrees to care plan and verbalized understanding.  He is well appearing, VSS.  Ambulates with a steady gait.    Alexzandra Bilton L. Trisha Mangle, PA-C 06/13/13 1191

## 2013-06-13 NOTE — ED Notes (Signed)
Had packing placed two days ago. Here to have it removed.

## 2013-06-13 NOTE — Discharge Instructions (Signed)
Epidermal Cyst  An epidermal cyst is usually a small, painless lump under the skin. Cysts often occur on the face, neck, stomach, chest, or genitals. The cyst may be filled with a bad smelling paste. Do not pop your cyst. Popping the cyst can cause pain and puffiness (swelling).  HOME CARE   · Only take medicines as told by your doctor.  · Take your medicine (antibiotics) as told. Finish it even if you start to feel better.  GET HELP RIGHT AWAY IF:  · Your cyst is tender, red, or puffy.  · You are not getting better, or you are getting worse.  · You have any questions or concerns.  MAKE SURE YOU:  · Understand these instructions.  · Will watch your condition.  · Will get help right away if you are not doing well or get worse.  Document Released: 06/30/2004 Document Revised: 11/22/2011 Document Reviewed: 11/29/2010  ExitCare® Patient Information ©2014 [employer].

## 2013-06-22 NOTE — ED Provider Notes (Signed)
Medical screening examination/treatment/procedure(s) were performed by non-physician practitioner and as supervising physician I was immediately available for consultation/collaboration.  EKG Interpretation   None         Aubryanna Nesheim, MD 06/22/13 0652 

## 2013-08-28 ENCOUNTER — Other Ambulatory Visit: Payer: Self-pay | Admitting: Gastroenterology

## 2013-08-28 ENCOUNTER — Encounter (HOSPITAL_COMMUNITY): Payer: Self-pay | Admitting: Pharmacy Technician

## 2013-08-28 NOTE — Progress Notes (Signed)
REVIEWED. CONTACT PT FOR REPEAT EGD WITH PROPOFOL TO ASSESS HEALING OF ULCER.

## 2013-08-28 NOTE — Progress Notes (Signed)
I Have him scheduled in the OR for EGD on Tuesday April 7th and he is aware and I have mailed him instructions

## 2013-09-03 NOTE — Patient Instructions (Signed)
Shizuo Biskup [place]  09/03/2013   Your procedure is scheduled on:  09/10/2013  Report to [facility] at  730  AM.  Call this number if you have problems the morning of surgery: [phone]   Remember:   Do not eat food or drink liquids after midnight.   Take these medicines the morning of surgery with A SIP OF WATER: none   Do not wear jewelry, make-up or nail polish.  Do not wear lotions, powders, or perfumes.   Do not shave 48 hours prior to surgery. Men may shave face and neck.  Do not bring valuables to the hospital.  [facility] is not responsible for any belongings or valuables.               Contacts, dentures or bridgework may not be worn into surgery.  Leave suitcase in the car. After surgery it may be brought to your room.  For patients admitted to the hospital, discharge time is determined by your  treatment team.               Patients discharged the day of surgery will not be allowed to drive home.  Name and phone number of your driver: family  Special Instructions: N/A   Please read over the following fact sheets that you were given: Pain Booklet, Coughing and Deep Breathing, Surgical Site Infection Prevention, Anesthesia Post-op Instructions and Care and Recovery After Surgery Esophagogastroduodenoscopy Esophagogastroduodenoscopy (EGD) is a procedure to examine the lining of the esophagus, stomach, and first part of the small intestine (duodenum). A long, flexible, lighted tube with a camera attached (endoscope) is inserted down the throat to view these organs. This procedure is done to detect problems or abnormalities, such as inflammation, bleeding, ulcers, or growths, in order to treat them. The procedure lasts about 5 20 minutes. It is usually an outpatient procedure, but it may need to be performed in emergency cases in the hospital. LET YOUR CAREGIVER KNOW ABOUT:   Allergies to food or medicine.  All medicines you are taking, including vitamins, herbs, eyedrops,  and over-the-counter medicines and creams.  Use of steroids (by mouth or creams).  Previous problems you or members of your family have had with the use of anesthetics.  Any blood disorders you have.  Previous surgeries you have had.  Other health problems you have.  Possibility of pregnancy, if this applies. RISKS AND COMPLICATIONS  Generally, EGD is a safe procedure. However, as with any procedure, complications can occur. Possible complications include:  Infection.  Bleeding.  Tearing (perforation) of the esophagus, stomach, or duodenum.  Difficulty breathing or not being able to breath.  Excessive sweating.  Spasms of the larynx.  Slowed heartbeat.  Low blood pressure. BEFORE THE PROCEDURE  Do not eat or drink anything for 6 8 hours before the procedure or as directed by your caregiver.  Ask your caregiver about changing or stopping your regular medicines.  If you wear dentures, be prepared to remove them before the procedure.  Arrange for someone to drive you home after the procedure. PROCEDURE   A vein will be accessed to give medicines and fluids. A medicine to relax you (sedative) and a pain reliever will be given through that access into the vein.  A numbing medicine (local anesthetic) may be sprayed on your throat for comfort and to stop you from gagging or coughing.  A mouth guard may be placed in your mouth to protect your teeth  and to keep you from biting on the endoscope.  You will be asked to lie on your left side.  The endoscope is inserted down your throat and into the esophagus, stomach, and duodenum.  Air is put through the endoscope to allow your caregiver to view the lining of your esophagus clearly.  The esophagus, stomach, and duodenum is then examined. During the exam, your caregiver may:  Remove tissue to be examined under a microscope (biopsy) for inflammation, infection, or other medical problems.  Remove growths.  Remove objects  (foreign bodies) that are stuck.  Treat any bleeding with medicines or other devices that stop tissues from bleeding (hot cauters, clipping devices).  Widen (dilate) or stretch narrowed areas of the esophagus and stomach.  The endoscope will then be withdrawn. AFTER THE PROCEDURE  You will be taken to a recovery area to be monitored. You will be able to go home once you are stable and alert.  Do not eat or drink anything until the local anesthetic and numbing medicines have worn off. You may choke.  It is normal to feel bloated, have pain with swallowing, or have a sore throat for a short time. This will wear off.  Your caregiver should be able to discuss his or her findings with you. It will take longer to discuss the test results if any biopsies were taken. Document Released: 09/23/2004 Document Revised: 05/09/2012 Document Reviewed: 04/25/2012 [facility] Patient Information 2014 [employer], [place]. PATIENT INSTRUCTIONS POST-ANESTHESIA  IMMEDIATELY FOLLOWING SURGERY:  Do not drive or operate machinery for the first twenty four hours after surgery.  Do not make any important decisions for twenty four hours after surgery or while taking narcotic pain medications or sedatives.  If you develop intractable nausea and vomiting or a severe headache please notify your doctor immediately.  FOLLOW-UP:  Please make an appointment with your surgeon as instructed. You do not need to follow up with anesthesia unless specifically instructed to do so.  WOUND CARE INSTRUCTIONS (if applicable):  Keep a dry clean dressing on the anesthesia/puncture wound site if there is drainage.  Once the wound has quit draining you may leave it open to air.  Generally you should leave the bandage intact for twenty four hours unless there is drainage.  If the epidural site drains for more than 36-48 hours please call the anesthesia department.  QUESTIONS?:  Please feel free to call your physician or the hospital operator if  you have any questions, and they will be happy to assist you.

## 2013-09-04 ENCOUNTER — Telehealth: Payer: Self-pay | Admitting: Gastroenterology

## 2013-09-04 ENCOUNTER — Encounter (HOSPITAL_COMMUNITY)
Admission: RE | Admit: 2013-09-04 | Discharge: 2013-09-04 | Disposition: A | Discharge status: Home / Self Care | Payer: Self-pay | Source: Ambulatory Visit | Attending: Gastroenterology | Admitting: Gastroenterology

## 2013-09-04 NOTE — Telephone Encounter (Signed)
[name] from Day [facility] called this morning and stated that [name] has N/S for his Pre Op appointment. He is scheduled for EGD w/Propofol on April 7th w/SLF. [facility] my call asap to be R/S

## 2013-09-04 NOTE — Telephone Encounter (Signed)
Pre op R/S for Thurs April 2nd at 1:15 and [name] is aware

## 2013-09-04 NOTE — Patient Instructions (Signed)
[name]  09/04/2013   Your procedure is scheduled on: 09/10/2013  Report to [facility] at 0730 AM.  Call this number if you have problems the morning of surgery: [phone]   Remember:   Do not eat food or drink liquids after midnight.   Take these medicines the morning of surgery with A SIP OF WATER:   Do not wear jewelry, make-up or nail polish.  Do not wear lotions, powders, or perfumes.   Do not shave 48 hours prior to surgery. Men may shave face and neck.  Do not bring valuables to the hospital.  [facility] is not responsible for any belongings or valuables.               Contacts, dentures or bridgework may not be worn into surgery.  Leave suitcase in the car. After surgery it may be brought to your room.  For patients admitted to the hospital, discharge time is determined by your treatment team.               Patients discharged the day of surgery will not be allowed to drive home.  Name and phone number of your driver: FAMILY Special Instructions:    Please read over the following fact sheets that you were given: Anesthesia Post-op Instructions and Care and Recovery After Surgery PATIENT INSTRUCTIONS POST-ANESTHESIA  IMMEDIATELY FOLLOWING SURGERY:  Do not drive or operate machinery for the first twenty four hours after surgery.  Do not make any important decisions for twenty four hours after surgery or while taking narcotic pain medications or sedatives.  If you develop intractable nausea and vomiting or a severe headache please notify your doctor immediately.  FOLLOW-UP:  Please make an appointment with your surgeon as instructed. You do not need to follow up with anesthesia unless specifically instructed to do so.  WOUND CARE INSTRUCTIONS (if applicable):  Keep a dry clean dressing on the anesthesia/puncture wound site if there is drainage.  Once the wound has quit draining you may leave it open to air.  Generally you should leave the bandage intact for twenty four  hours unless there is drainage.  If the epidural site drains for more than 36-48 hours please call the anesthesia department.  QUESTIONS?:  Please feel free to call your physician or the hospital operator if you have any questions, and they will be happy to assist you.      Esophagogastroduodenoscopy Esophagogastroduodenoscopy (EGD) is a procedure to examine the lining of the esophagus, stomach, and first part of the small intestine (duodenum). A long, flexible, lighted tube with a camera attached (endoscope) is inserted down the throat to view these organs. This procedure is done to detect problems or abnormalities, such as inflammation, bleeding, ulcers, or growths, in order to treat them. The procedure lasts about 5 20 minutes. It is usually an outpatient procedure, but it may need to be performed in emergency cases in the hospital. LET YOUR CAREGIVER KNOW ABOUT:   Allergies to food or medicine.  All medicines you are taking, including vitamins, herbs, eyedrops, and over-the-counter medicines and creams.  Use of steroids (by mouth or creams).  Previous problems you or members of your family have had with the use of anesthetics.  Any blood disorders you have.  Previous surgeries you have had.  Other health problems you have.  Possibility of pregnancy, if this applies. RISKS AND COMPLICATIONS  Generally, EGD is a safe procedure. However, as with any procedure, complications can occur. Possible  complications include:  Infection.  Bleeding.  Tearing (perforation) of the esophagus, stomach, or duodenum.  Difficulty breathing or not being able to breath.  Excessive sweating.  Spasms of the larynx.  Slowed heartbeat.  Low blood pressure. BEFORE THE PROCEDURE  Do not eat or drink anything for 6 8 hours before the procedure or as directed by your caregiver.  Ask your caregiver about changing or stopping your regular medicines.  If you wear dentures, be prepared to remove  them before the procedure.  Arrange for someone to drive you home after the procedure. PROCEDURE   A vein will be accessed to give medicines and fluids. A medicine to relax you (sedative) and a pain reliever will be given through that access into the vein.  A numbing medicine (local anesthetic) may be sprayed on your throat for comfort and to stop you from gagging or coughing.  A mouth guard may be placed in your mouth to protect your teeth and to keep you from biting on the endoscope.  You will be asked to lie on your left side.  The endoscope is inserted down your throat and into the esophagus, stomach, and duodenum.  Air is put through the endoscope to allow your caregiver to view the lining of your esophagus clearly.  The esophagus, stomach, and duodenum is then examined. During the exam, your caregiver may:  Remove tissue to be examined under a microscope (biopsy) for inflammation, infection, or other medical problems.  Remove growths.  Remove objects (foreign bodies) that are stuck.  Treat any bleeding with medicines or other devices that stop tissues from bleeding (hot cauters, clipping devices).  Widen (dilate) or stretch narrowed areas of the esophagus and stomach.  The endoscope will then be withdrawn. AFTER THE PROCEDURE  You will be taken to a recovery area to be monitored. You will be able to go home once you are stable and alert.  Do not eat or drink anything until the local anesthetic and numbing medicines have worn off. You may choke.  It is normal to feel bloated, have pain with swallowing, or have a sore throat for a short time. This will wear off.  Your caregiver should be able to discuss his or her findings with you. It will take longer to discuss the test results if any biopsies were taken. Document Released: 09/23/2004 Document Revised: 05/09/2012 Document Reviewed: 04/25/2012 [facility] Patient Information 2014 [place], [place].

## 2013-09-05 ENCOUNTER — Encounter (HOSPITAL_COMMUNITY)
Admission: RE | Admit: 2013-09-05 | Discharge: 2013-09-05 | Disposition: A | Discharge status: Home / Self Care | Payer: Self-pay | Source: Ambulatory Visit | Attending: Gastroenterology | Admitting: Gastroenterology

## 2013-09-05 ENCOUNTER — Encounter (HOSPITAL_COMMUNITY): Payer: Self-pay | Admitting: Emergency Medicine

## 2013-09-05 ENCOUNTER — Emergency Department (HOSPITAL_COMMUNITY)
Admission: EM | Admit: 2013-09-05 | Discharge: 2013-09-06 | Disposition: A | Discharge status: Home / Self Care | Payer: Self-pay | Attending: Emergency Medicine | Admitting: Emergency Medicine

## 2013-09-05 DIAGNOSIS — Z9889 Other specified postprocedural states: Secondary | ICD-10-CM | POA: Insufficient documentation

## 2013-09-05 DIAGNOSIS — K2970 Gastritis, unspecified, without bleeding: Secondary | ICD-10-CM | POA: Insufficient documentation

## 2013-09-05 DIAGNOSIS — F17200 Nicotine dependence, unspecified, uncomplicated: Secondary | ICD-10-CM | POA: Insufficient documentation

## 2013-09-05 DIAGNOSIS — K2990 Gastroduodenitis, unspecified, without bleeding: Principal | ICD-10-CM

## 2013-09-05 LAB — CBC WITH DIFFERENTIAL/PLATELET
Basophils Absolute: 0 10*3/uL (ref 0.0–0.1)
Basophils Relative: 0 % (ref 0–1)
Eosinophils Absolute: 0.1 10*3/uL (ref 0.0–0.7)
Eosinophils Relative: 2 % (ref 0–5)
HCT: 39.9 % (ref 39.0–52.0)
Hemoglobin: 14.1 g/dL (ref 13.0–17.0)
LYMPHS ABS: 3.5 10*3/uL (ref 0.7–4.0)
LYMPHS PCT: 55 % — AB (ref 12–46)
MCH: 31.6 pg (ref 26.0–34.0)
MCHC: 35.3 g/dL (ref 30.0–36.0)
MCV: 89.5 fL (ref 78.0–100.0)
Monocytes Absolute: 0.4 10*3/uL (ref 0.1–1.0)
Monocytes Relative: 7 % (ref 3–12)
NEUTROS ABS: 2.3 10*3/uL (ref 1.7–7.7)
NEUTROS PCT: 36 % — AB (ref 43–77)
PLATELETS: 236 10*3/uL (ref 150–400)
RBC: 4.46 MIL/uL (ref 4.22–5.81)
RDW: 12.8 % (ref 11.5–15.5)
WBC: 6.4 10*3/uL (ref 4.0–10.5)

## 2013-09-05 LAB — COMPREHENSIVE METABOLIC PANEL
ALK PHOS: 97 U/L (ref 39–117)
ALT: 10 U/L (ref 0–53)
AST: 16 U/L (ref 0–37)
Albumin: 3.8 g/dL (ref 3.5–5.2)
BUN: 8 mg/dL (ref 6–23)
CHLORIDE: 101 meq/L (ref 96–112)
CO2: 26 mEq/L (ref 19–32)
Calcium: 9.4 mg/dL (ref 8.4–10.5)
Creatinine, Ser: 1.07 mg/dL (ref 0.50–1.35)
GFR calc non Af Amer: 87 mL/min — ABNORMAL LOW (ref >90)
GLUCOSE: 106 mg/dL — AB (ref 70–99)
POTASSIUM: 3.8 meq/L (ref 3.7–5.3)
SODIUM: 139 meq/L (ref 137–147)
TOTAL PROTEIN: 7.7 g/dL (ref 6.0–8.3)
Total Bilirubin: 1.2 mg/dL (ref 0.3–1.2)

## 2013-09-05 LAB — LIPASE, BLOOD: Lipase: 14 U/L (ref 11–59)

## 2013-09-05 MED ORDER — FAMOTIDINE IN NACL 20-0.9 MG/50ML-% IV SOLN: 20.0000 mg | Freq: Once | INTRAVENOUS | Status: DC

## 2013-09-05 MED ORDER — SODIUM CHLORIDE 0.9 % IV BOLUS (SEPSIS)
1000.0000 mL | Freq: Once | INTRAVENOUS | Status: AC
  Administered 2013-09-05: 1000 mL via INTRAVENOUS

## 2013-09-05 MED ORDER — PROMETHAZINE HCL 25 MG PO TABS: 25.0000 mg | ORAL_TABLET | Freq: Four times a day (QID) | ORAL | Status: DC | PRN

## 2013-09-05 MED ORDER — ONDANSETRON HCL 4 MG/2ML IJ SOLN
4.0000 mg | Freq: Once | INTRAMUSCULAR | Status: AC
  Administered 2013-09-05: 4 mg via INTRAVENOUS
  Filled 2013-09-05: qty 2

## 2013-09-05 MED ORDER — MORPHINE SULFATE 4 MG/ML IJ SOLN
4.0000 mg | Freq: Once | INTRAMUSCULAR | Status: AC
  Administered 2013-09-05: 4 mg via INTRAVENOUS
  Filled 2013-09-05: qty 1

## 2013-09-05 MED ORDER — FAMOTIDINE IN NACL 20-0.9 MG/50ML-% IV SOLN
INTRAVENOUS | Status: AC
  Administered 2013-09-05: 23:00:00 20 mg via INTRAVENOUS
  Filled 2013-09-05: qty 50

## 2013-09-05 MED ORDER — FAMOTIDINE IN NACL 20-0.9 MG/50ML-% IV SOLN
20.0000 mg | Freq: Once | INTRAVENOUS | Status: AC
  Administered 2013-09-05: 20 mg via INTRAVENOUS

## 2013-09-05 NOTE — ED Provider Notes (Signed)
CSN: [id number]     Arrival date & time 09/05/13  1824 History  This chart was scribed for Donnetta Hutching, MD by Ardelia Mems, ED Scribe. This patient was seen in room APA12/APA12 and the patient's care was started at 7:53 PM.   Chief Complaint  Patient presents with  . Abdominal Pain    The history is provided by the patient. No language interpreter was used.    HPI Comments: [name] is a 38 y.o. male with a history of GERD who presents to the Emergency Department complaining of intermittent, moderate epigastric abdominal pain onset 3 days ago. He reports multiple associated episodes of emesis over the past 3 days. He states that he has a history of similar abdominal pain in the past and was told that the pain was related to gastritis. He states that he works in [employer], and that he was able to work today. He denies any surgical abdominal history. He states that is a daily smoker and that he drinks about 1 pint of liquor per day.   Past Medical History  Diagnosis Date  . GERD (gastroesophageal reflux disease)    Past Surgical History  Procedure Laterality Date  . None    . Esophagogastroduodenoscopy (egd) with propofol N/A 03/19/2013    Procedure: ESOPHAGOGASTRODUODENOSCOPY (EGD) WITH PROPOFOL;  Surgeon: West Bali, MD;  Location: [facility];  Service: Endoscopy;  Laterality: N/A;  . Esophageal biopsy N/A 03/19/2013    Procedure: Gastric Biopsies;  Surgeon: West Bali, MD;  Location: [facility];  Service: Endoscopy;  Laterality: N/A;   Family History  Problem Relation Age of Onset  . Colon cancer Neg Hx   . Kidney failure Father    History  Substance Use Topics  . Smoking status: Current Every Day Smoker -- 0.50 packs/day for 15 years    Types: Cigarettes  . Smokeless tobacco: Never Used  . Alcohol Use: Yes     Comment: Hx of ETOH abuse, none in about 6 weeks as of 10/1.     Review of Systems A complete 10 system review of systems was obtained and all systems are  negative except as noted in the HPI and PMH.   Allergies  Contrast media  Home Medications   Current Outpatient Rx  Name  Route  Sig  Dispense  Refill  . promethazine (PHENERGAN) 25 MG tablet   Oral   Take 1 tablet (25 mg total) by mouth every 6 (six) hours as needed for nausea or vomiting.   20 tablet   0     Triage Vitals: BP 107/62  Pulse 76  Temp(Src) 98.4 F (36.9 C) (Oral)  Resp 20  Ht 5\' 11"  (1.803 m)  Wt 172 lb (78.019 kg)  BMI 24.00 kg/m2  SpO2 100%  Physical Exam  Nursing note and vitals reviewed. Constitutional: He is oriented to person, place, and time. He appears well-developed and well-nourished.  HENT:  Head: Normocephalic and atraumatic.  Eyes: Conjunctivae and EOM are normal. Pupils are equal, round, and reactive to light.  Neck: Normal range of motion. Neck supple.  Cardiovascular: Normal rate, regular rhythm and normal heart sounds.   Pulmonary/Chest: Effort normal and breath sounds normal.  Abdominal: Soft. Bowel sounds are normal. There is tenderness (epigastric).  Musculoskeletal: Normal range of motion.  Neurological: He is alert and oriented to person, place, and time.  Skin: Skin is warm and dry.  Psychiatric: He has a normal mood and affect. His behavior is normal.  ED Course  Procedures (including critical care time)  DIAGNOSTIC STUDIES: Oxygen Saturation is 100% on RA, normal by my interpretation.    COORDINATION OF CARE: 7:58 PM- Discussed plan to order IV fluids, pain medication and medication for nausea. Pt advised of plan for treatment and pt agrees.  Medications  sodium chloride 0.9 % bolus 1,000 mL (0 mLs Intravenous Stopped 09/05/13 2238)  morphine 4 MG/ML injection 4 mg (4 mg Intravenous Given 09/05/13 2017)  ondansetron (ZOFRAN) injection 4 mg (4 mg Intravenous Given 09/05/13 2017)  sodium chloride 0.9 % bolus 1,000 mL (1,000 mLs Intravenous New Bag/Given 09/05/13 2238)  ondansetron (ZOFRAN) injection 4 mg (4 mg Intravenous Given  09/05/13 2058)  famotidine (PEPCID) IVPB 20 mg (20 mg Intravenous New Bag/Given 09/05/13 2239)   Labs Review Labs Reviewed  CBC WITH DIFFERENTIAL - Abnormal; Notable for the following:    Neutrophils Relative % 36 (*)    Lymphocytes Relative 55 (*)    All other components within normal limits  COMPREHENSIVE METABOLIC PANEL - Abnormal; Notable for the following:    Glucose, Bld 106 (*)    GFR calc non Af Amer 87 (*)    All other components within normal limits  LIPASE, BLOOD   Imaging Review No results found.   EKG Interpretation None      MDM   Final diagnoses:  Gastritis    Patient is heavy consumer of alcohol. Suspect gastritis secondary to alcohol. Lipase normal.  Rx IV fluids, IV Pepcid.  Discharge medications Phenergan 25 mg. Rx for over-the-counter ulcer medications   I personally performed the services described in this documentation, which was scribed in my presence. The recorded information has been reviewed and is accurate.  Donnetta HutchingBrian Sabian Kuba, MD 09/06/13 0000

## 2013-09-05 NOTE — Telephone Encounter (Signed)
Procedure for 04/07 EGD w/Propofol has been cancelled due to Mr. [name] noshowing for the 2nd time for Pre Op

## 2013-09-05 NOTE — Telephone Encounter (Signed)
REVIEWED. AGREE.  Cancel EGD.

## 2013-09-05 NOTE — ED Notes (Signed)
abd pain/n/v/constipation x 3 days.

## 2013-09-05 NOTE — ED Notes (Signed)
Patient given swabs to moisten lips and tongue. Instructed not to drink anything til he has his scans.

## 2013-09-05 NOTE — Discharge Instructions (Signed)
Gastritis, Adult Gastritis is soreness and puffiness (inflammation) of the lining of the stomach. If you do not get help, gastritis can cause bleeding and sores (ulcers) in the stomach. HOME CARE   Only take medicine as told by your doctor.  If you were given antibiotic medicines, take them as told. Finish the medicines even if you start to feel better.  Drink enough fluids to keep your pee (urine) clear or pale yellow.  Avoid foods and drinks that make your problems worse. Foods you may want to avoid include:  Caffeine or alcohol.  Chocolate.  Mint.  Garlic and onions.  Spicy foods.  Citrus fruits, including oranges, lemons, or limes.  Food containing tomatoes, including sauce, chili, salsa, and pizza.  Fried and fatty foods.  Eat small meals throughout the day instead of large meals. GET HELP RIGHT AWAY IF:   You have black or dark red poop (stools).  You throw up (vomit) blood. It may look like coffee grounds.  You cannot keep fluids down.  Your belly (abdominal) pain gets worse.  You have a fever.  You do not feel better after 1 week.  You have any other questions or concerns. MAKE SURE YOU:   Understand these instructions.  Will watch your condition.  Will get help right away if you are not doing well or get worse. Document Released: 11/09/2007 Document Revised: 08/15/2011 Document Reviewed: 07/06/2011 [facility] Patient Information 2014 [place], [place].   You must stop drinking alcohol. Prescription for nausea medicine. Take any of the over-the-counter ulcer medications at [facility] or [facility].

## 2013-09-06 MED ORDER — ONDANSETRON HCL 4 MG/2ML IJ SOLN
INTRAMUSCULAR | Status: AC
  Filled 2013-09-06: qty 2

## 2013-09-06 MED ORDER — ONDANSETRON HCL 4 MG/2ML IJ SOLN
4.0000 mg | Freq: Once | INTRAMUSCULAR | Status: AC
  Administered 2013-09-06: 4 mg via INTRAMUSCULAR

## 2013-09-06 MED ORDER — MORPHINE SULFATE 4 MG/ML IJ SOLN
4.0000 mg | Freq: Once | INTRAMUSCULAR | Status: AC
  Administered 2013-09-06: 4 mg via INTRAVENOUS

## 2013-09-06 MED ORDER — MORPHINE SULFATE 4 MG/ML IJ SOLN
INTRAMUSCULAR | Status: AC
  Administered 2013-09-06: 4 mg via INTRAVENOUS
  Filled 2013-09-06: qty 1

## 2013-09-10 ENCOUNTER — Encounter (HOSPITAL_COMMUNITY): Admission: RE | Payer: Self-pay | Source: Ambulatory Visit

## 2013-09-10 ENCOUNTER — Ambulatory Visit (HOSPITAL_COMMUNITY): Admission: RE | Admit: 2013-09-10 | Payer: Self-pay | Source: Ambulatory Visit | Admitting: Gastroenterology

## 2013-09-10 SURGERY — ESOPHAGOGASTRODUODENOSCOPY (EGD) WITH PROPOFOL
Anesthesia: Monitor Anesthesia Care

## 2013-09-28 ENCOUNTER — Emergency Department (HOSPITAL_COMMUNITY): Payer: Self-pay

## 2013-09-28 ENCOUNTER — Encounter (HOSPITAL_COMMUNITY): Payer: Self-pay | Admitting: Emergency Medicine

## 2013-09-28 ENCOUNTER — Inpatient Hospital Stay (HOSPITAL_COMMUNITY)
Admission: EM | Admit: 2013-09-28 | Discharge: 2013-10-03 | DRG: 326 | Disposition: A | Discharge status: Home / Self Care | Payer: Self-pay | Attending: General Surgery | Admitting: General Surgery

## 2013-09-28 ENCOUNTER — Emergency Department (HOSPITAL_COMMUNITY)
Admission: EM | Admit: 2013-09-28 | Discharge: 2013-09-28 | Disposition: A | Discharge status: Home / Self Care | Payer: Self-pay | Attending: Emergency Medicine | Admitting: Emergency Medicine

## 2013-09-28 DIAGNOSIS — K659 Peritonitis, unspecified: Secondary | ICD-10-CM | POA: Diagnosis present

## 2013-09-28 DIAGNOSIS — K5289 Other specified noninfective gastroenteritis and colitis: Secondary | ICD-10-CM | POA: Insufficient documentation

## 2013-09-28 DIAGNOSIS — K668 Other specified disorders of peritoneum: Secondary | ICD-10-CM

## 2013-09-28 DIAGNOSIS — K529 Noninfective gastroenteritis and colitis, unspecified: Secondary | ICD-10-CM

## 2013-09-28 DIAGNOSIS — F17200 Nicotine dependence, unspecified, uncomplicated: Secondary | ICD-10-CM | POA: Insufficient documentation

## 2013-09-28 DIAGNOSIS — Z9889 Other specified postprocedural states: Secondary | ICD-10-CM | POA: Insufficient documentation

## 2013-09-28 DIAGNOSIS — Z79899 Other long term (current) drug therapy: Secondary | ICD-10-CM | POA: Insufficient documentation

## 2013-09-28 DIAGNOSIS — K275 Chronic or unspecified peptic ulcer, site unspecified, with perforation: Principal | ICD-10-CM | POA: Diagnosis present

## 2013-09-28 DIAGNOSIS — K219 Gastro-esophageal reflux disease without esophagitis: Secondary | ICD-10-CM | POA: Insufficient documentation

## 2013-09-28 LAB — RAPID URINE DRUG SCREEN, HOSP PERFORMED
AMPHETAMINES: NOT DETECTED
Barbiturates: NOT DETECTED
Benzodiazepines: NOT DETECTED
Cocaine: NOT DETECTED
Opiates: NOT DETECTED
Tetrahydrocannabinol: POSITIVE — AB

## 2013-09-28 LAB — COMPREHENSIVE METABOLIC PANEL
ALK PHOS: 92 U/L (ref 39–117)
ALT: 10 U/L (ref 0–53)
AST: 17 U/L (ref 0–37)
Albumin: 3.7 g/dL (ref 3.5–5.2)
BUN: 9 mg/dL (ref 6–23)
CO2: 26 mEq/L (ref 19–32)
Calcium: 9.3 mg/dL (ref 8.4–10.5)
Chloride: 103 mEq/L (ref 96–112)
Creatinine, Ser: 1.1 mg/dL (ref 0.50–1.35)
GFR calc Af Amer: >90 mL/min (ref >90)
GFR calc non Af Amer: 84 mL/min — ABNORMAL LOW (ref >90)
GLUCOSE: 93 mg/dL (ref 70–99)
POTASSIUM: 3.8 meq/L (ref 3.7–5.3)
Sodium: 141 mEq/L (ref 137–147)
Total Bilirubin: 0.7 mg/dL (ref 0.3–1.2)
Total Protein: 7.5 g/dL (ref 6.0–8.3)

## 2013-09-28 LAB — CBC WITH DIFFERENTIAL/PLATELET
Basophils Absolute: 0 10*3/uL (ref 0.0–0.1)
Basophils Relative: 1 % (ref 0–1)
Eosinophils Absolute: 0.3 10*3/uL (ref 0.0–0.7)
Eosinophils Relative: 4 % (ref 0–5)
HCT: 41.8 % (ref 39.0–52.0)
Hemoglobin: 14.5 g/dL (ref 13.0–17.0)
LYMPHS PCT: 44 % (ref 12–46)
Lymphs Abs: 3.2 10*3/uL (ref 0.7–4.0)
MCH: 31 pg (ref 26.0–34.0)
MCHC: 34.7 g/dL (ref 30.0–36.0)
MCV: 89.3 fL (ref 78.0–100.0)
Monocytes Absolute: 0.4 10*3/uL (ref 0.1–1.0)
Monocytes Relative: 6 % (ref 3–12)
Neutro Abs: 3.3 10*3/uL (ref 1.7–7.7)
Neutrophils Relative %: 45 % (ref 43–77)
Platelets: 300 10*3/uL (ref 150–400)
RBC: 4.68 MIL/uL (ref 4.22–5.81)
RDW: 12.9 % (ref 11.5–15.5)
WBC: 7.2 10*3/uL (ref 4.0–10.5)

## 2013-09-28 LAB — ETHANOL: Alcohol, Ethyl (B): <11 mg/dL (ref 0–11)

## 2013-09-28 LAB — LIPASE, BLOOD: Lipase: 19 U/L (ref 11–59)

## 2013-09-28 MED ORDER — DIPHENHYDRAMINE HCL 50 MG/ML IJ SOLN
25.0000 mg | Freq: Once | INTRAMUSCULAR | Status: AC
  Administered 2013-09-28: 25 mg via INTRAVENOUS
  Filled 2013-09-28: qty 1

## 2013-09-28 MED ORDER — HYDROCORTISONE NA SUCCINATE PF 250 MG IJ SOLR
200.0000 mg | Freq: Once | INTRAMUSCULAR | Status: AC
  Administered 2013-09-28: 200 mg via INTRAVENOUS
  Filled 2013-09-28: qty 200

## 2013-09-28 MED ORDER — IOHEXOL 300 MG/ML  SOLN
50.0000 mL | Freq: Once | INTRAMUSCULAR | Status: AC | PRN
Start: 2013-09-28 — End: 2013-09-28
  Administered 2013-09-28: 50 mL via ORAL

## 2013-09-28 MED ORDER — SODIUM CHLORIDE 0.9 % IV BOLUS (SEPSIS)
1000.0000 mL | Freq: Once | INTRAVENOUS | Status: AC
  Administered 2013-09-28: 1000 mL via INTRAVENOUS

## 2013-09-28 MED ORDER — OXYCODONE-ACETAMINOPHEN 5-325 MG PO TABS: 1.0000 | ORAL_TABLET | ORAL | Status: DC | PRN

## 2013-09-28 MED ORDER — SODIUM CHLORIDE 0.9 % IV SOLN
INTRAVENOUS | Status: DC
  Administered 2013-09-28: 1000 mL via INTRAVENOUS

## 2013-09-28 MED ORDER — METRONIDAZOLE 500 MG PO TABS
500.0000 mg | ORAL_TABLET | Freq: Once | ORAL | Status: AC
  Administered 2013-09-28: 500 mg via ORAL
  Filled 2013-09-28: qty 1

## 2013-09-28 MED ORDER — FENTANYL CITRATE 0.05 MG/ML IJ SOLN
100.0000 ug | INTRAMUSCULAR | Status: DC | PRN
  Administered 2013-09-28 (×2): 100 ug via INTRAVENOUS
  Filled 2013-09-28 (×2): qty 2

## 2013-09-28 MED ORDER — CIPROFLOXACIN HCL 250 MG PO TABS
500.0000 mg | ORAL_TABLET | Freq: Once | ORAL | Status: AC
  Administered 2013-09-28: 500 mg via ORAL
  Filled 2013-09-28: qty 2

## 2013-09-28 MED ORDER — CIPROFLOXACIN HCL 500 MG PO TABS: 500.0000 mg | ORAL_TABLET | Freq: Two times a day (BID) | ORAL | Status: DC

## 2013-09-28 MED ORDER — METRONIDAZOLE 500 MG PO TABS: 500.0000 mg | ORAL_TABLET | Freq: Two times a day (BID) | ORAL | Status: DC

## 2013-09-28 MED ORDER — IOHEXOL 300 MG/ML  SOLN
100.0000 mL | Freq: Once | INTRAMUSCULAR | Status: AC | PRN
  Administered 2013-09-28: 100 mL via INTRAVENOUS

## 2013-09-28 MED ORDER — DIPHENHYDRAMINE HCL 50 MG/ML IJ SOLN
INTRAMUSCULAR | Status: AC
  Administered 2013-09-28: 50 mg
  Filled 2013-09-28: qty 1

## 2013-09-28 NOTE — ED Notes (Signed)
Pt c/o epigastric pain x 2 months. Has been seen here for this, just last week per pt. Pt states constant pain x 2 weeks now. No vomiting since 2 days ago. Diarrhea x 1 today. Mm wet. Nad. Slight grimacing noted. Denies urinary changes.

## 2013-09-28 NOTE — ED Notes (Signed)
C/o itching upon arrival back froom CT. No rashes or urticaria noted. Medicated with benadryl

## 2013-09-28 NOTE — ED Provider Notes (Signed)
CSN: [id number]     Arrival date & time 09/28/13  1409 History   First MD Initiated Contact with Patient 09/28/13 1514     Chief Complaint  Patient presents with  . Abdominal Pain     (Consider location/radiation/quality/duration/timing/severity/associated sxs/prior Treatment) Patient is a 38 y.o. male presenting with abdominal pain. The history is provided by the patient.  Abdominal Pain  He complains of abdominal pain, for 2 months that worsened today. He had vomiting several days ago, and diarrhea today. He denies trauma. He denies fever, chills, chest pain or cough. The pain is intractable and intolerable. He has not been able to eat today. He states that he smokes cigarettes and drinks alcohol. He has previously been seen for this pain. There are no other known modifying factors.  Past Medical History  Diagnosis Date  . GERD (gastroesophageal reflux disease)    Past Surgical History  Procedure Laterality Date  . None    . Esophagogastroduodenoscopy (egd) with propofol N/A 03/19/2013    Procedure: ESOPHAGOGASTRODUODENOSCOPY (EGD) WITH PROPOFOL;  Surgeon: West BaliSandi L Fields, MD;  Location: [facility];  Service: Endoscopy;  Laterality: N/A;  . Esophageal biopsy N/A 03/19/2013    Procedure: Gastric Biopsies;  Surgeon: West BaliSandi L Fields, MD;  Location: [facility];  Service: Endoscopy;  Laterality: N/A;   Family History  Problem Relation Age of Onset  . Colon cancer Neg Hx   . Kidney failure Father    History  Substance Use Topics  . Smoking status: Current Every Day Smoker -- 0.50 packs/day for 15 years    Types: Cigarettes  . Smokeless tobacco: Never Used  . Alcohol Use: Yes     Comment: beer twice a week.     Review of Systems  Gastrointestinal: Positive for abdominal pain.  All other systems reviewed and are negative.     Allergies  Contrast media  Home Medications   Prior to Admission medications   Medication Sig Start Date End Date Taking? Authorizing Provider  ranitidine  (ZANTAC) 75 MG tablet Take 75 mg by mouth 2 (two) times daily.   Yes Historical Provider, MD   BP 105/61  Pulse 81  Temp(Src) 98.4 F (36.9 C) (Oral)  Resp 16  SpO2 100% Physical Exam  Nursing note and vitals reviewed. Constitutional: He is oriented to person, place, and time. He appears well-developed and well-nourished. He appears distressed (he is crying secondary to the pain).  HENT:  Head: Normocephalic and atraumatic.  Right Ear: External ear normal.  Left Ear: External ear normal.  Eyes: Conjunctivae and EOM are normal. Pupils are equal, round, and reactive to light.  Neck: Normal range of motion and phonation normal. Neck supple.  Cardiovascular: Normal rate, regular rhythm, normal heart sounds and intact distal pulses.   Pulmonary/Chest: Effort normal and breath sounds normal. No respiratory distress. He has no wheezes. He exhibits no bony tenderness.  Abdominal: Soft. He exhibits no mass. There is tenderness (Moderate, diffuse). There is rebound (diffuse) and guarding.  No abdominal or inguinal hernias palpated  Musculoskeletal: Normal range of motion.  Neurological: He is alert and oriented to person, place, and time. No cranial nerve deficit or sensory deficit. He exhibits normal muscle tone. Coordination normal.  Skin: Skin is warm, dry and intact.  Psychiatric: He has a normal mood and affect. His behavior is normal. Judgment and thought content normal.    ED Course  Procedures (including critical care time) Medications  0.9 %  sodium chloride infusion (1,000 mLs Intravenous  New Bag/Given 09/28/13 1922)  fentaNYL (SUBLIMAZE) injection 100 mcg (100 mcg Intravenous Given 09/28/13 1921)  ciprofloxacin (CIPRO) tablet 500 mg (not administered)  metroNIDAZOLE (FLAGYL) tablet 500 mg (not administered)  sodium chloride 0.9 % bolus 1,000 mL (0 mLs Intravenous Stopped 09/28/13 1727)  hydrocortisone sodium succinate (SOLU-CORTEF) injection 200 mg (200 mg Intravenous Given 09/28/13  1749)  diphenhydrAMINE (BENADRYL) injection 25 mg (25 mg Intravenous Given 09/28/13 1944)  iohexol (OMNIPAQUE) 300 MG/ML solution 50 mL (50 mLs Oral Contrast Given 09/28/13 2055)  iohexol (OMNIPAQUE) 300 MG/ML solution 100 mL (100 mLs Intravenous Contrast Given 09/28/13 2056)  diphenhydrAMINE (BENADRYL) 50 MG/ML injection (50 mg  Given 09/28/13 2115)    Patient Vitals for the past 24 hrs:  BP Temp Temp src Pulse Resp SpO2  09/28/13 2118 105/61 mmHg - - 81 16 100 %  09/28/13 2000 108/57 mmHg - - 48 - 98 %  09/28/13 1945 117/47 mmHg - - 50 - 98 %  09/28/13 1800 111/55 mmHg - - 52 - 100 %  09/28/13 1700 108/59 mmHg - - 50 - 99 %  09/28/13 1645 - - - 51 - 100 %  09/28/13 1630 - - - 49 - 100 %  09/28/13 1615 - - - 60 - 100 %  09/28/13 1600 108/56 mmHg - - 63 - 100 %  09/28/13 1500 117/53 mmHg - - 64 - 100 %  09/28/13 1458 125/98 mmHg 98.4 F (36.9 C) Oral 63 20 100 %   Imaging delay- patient required pretreatment with steroids and antihistamine, for allergy to IV dye, with a three-hour prep, time.   9:30 PM Reevaluation with update and discussion. After initial assessment and treatment, an updated evaluation reveals he's feeling better and states that the abdominal pain is "coming and going". He had some mild itching after the CT scan that improved after a treatment with Benadryl. Findings were discussed with the patient, and all questions were answered.Flint Melter. Raife Lizer L Jani Ploeger     Labs Review Labs Reviewed  COMPREHENSIVE METABOLIC PANEL - Abnormal; Notable for the following:    GFR calc non Af Amer 84 (*)    All other components within normal limits  URINE RAPID DRUG SCREEN (HOSP PERFORMED) - Abnormal; Notable for the following:    Tetrahydrocannabinol POSITIVE (*)    All other components within normal limits  LIPASE, BLOOD  CBC WITH DIFFERENTIAL  ETHANOL    Imaging Review Ct Abdomen Pelvis W Contrast  09/28/2013   CLINICAL DATA:  Left flank pain.  Epigastric pain.  EXAM: CT ABDOMEN AND  PELVIS WITH CONTRAST  TECHNIQUE: Multidetector CT imaging of the abdomen and pelvis was performed using the standard protocol following bolus administration of intravenous contrast.  CONTRAST:  50 mL OMNIPAQUE IOHEXOL 300 MG/ML SOLN, 100 mL OMNIPAQUE IOHEXOL 300 MG/ML SOLN  COMPARISON:  CT abdomen and pelvis 02/06/2013.  FINDINGS: The lung bases are clear.  No pleural or pericardial effusion.  The gallbladder, liver, spleen, adrenal glands, pancreas and kidneys appear normal. There is a small volume of free pelvic fluid. The appendix and colon appear normal. The stomach is unremarkable. Multiple fluid-filled loops of small bowel are identified and demonstrate mild wall thickening without dilatation. There is no pneumatosis, portal venous gas, focal fluid collection or free intraperitoneal air. No lymphadenopathy is identified.  Bullet fragment projecting anterior to the right femur is identified. Irregularity of the posterior right acetabulum and inferior aspect of the right femoral head is likely posttraumatic. The appearance is unchanged.  IMPRESSION:  Small volume of free pelvic fluid with mild bowel wall thickening and multiple fluid-filled loops most consistent with infectious or inflammatory enteritis. The terminal ileum appears normal.   Electronically Signed   By: Drusilla Kanner M.D.   On: 09/28/2013 21:15     EKG Interpretation None      MDM   Final diagnoses:  Enteritis    Abdominal pain, with findings for inflammatory versus infectious enteritis. Patient has never had a history of inflammatory bowel disease. He is hemodynamically stable. His pain has been controlled.  Nursing Notes Reviewed/ Care Coordinated Applicable Imaging Reviewed Interpretation of Laboratory Data incorporated into ED treatment  The patient appears reasonably screened and/or stabilized for discharge and I doubt any other medical condition or other [facility] requiring further screening, evaluation, or treatment in the  ED at this time prior to discharge.  Plan: Home Medications- Cipro, Flagyl, Percocet; Home Treatments- Gradually advance diet; return here if the recommended treatment, does not improve the symptoms; Recommended follow up- GI f/u in 3-5 days     Flint Melter, MD 09/28/13 2135

## 2013-09-28 NOTE — ED Notes (Signed)
EDP in with pt. Pt now agreeing to stay.

## 2013-09-28 NOTE — ED Notes (Signed)
Just discharged from here 10:30, states pain returned upon travel home. Presents now with cramping pain right lower abd. Denies nausea or any BM

## 2013-09-28 NOTE — ED Notes (Addendum)
AC aware needing solucortef. Pt aware will have to wait 3 hrs after receiving solu cortef to start ct scan. States he is not waiting 3 more hrs. edp aware Pt resting. Nad.

## 2013-09-28 NOTE — Discharge Instructions (Signed)
Your CT scan indicated that you had enteritis. There are various types of enteritis. You'll need to followup with a specialist for further evaluation and treatment. It is important to drink a lot of fluids. You can gradually advance her diet but avoid spicy or greasy foods. Return here, if needed, for problems.

## 2013-09-29 ENCOUNTER — Encounter (HOSPITAL_COMMUNITY): Payer: Self-pay | Admitting: Anesthesiology

## 2013-09-29 ENCOUNTER — Emergency Department (HOSPITAL_COMMUNITY): Payer: Self-pay | Admitting: Anesthesiology

## 2013-09-29 ENCOUNTER — Emergency Department (HOSPITAL_COMMUNITY): Payer: Self-pay

## 2013-09-29 ENCOUNTER — Encounter (HOSPITAL_COMMUNITY)
Admission: EM | Disposition: A | Discharge status: Home / Self Care | Payer: Self-pay | Source: Home / Self Care | Attending: General Surgery

## 2013-09-29 DIAGNOSIS — K275 Chronic or unspecified peptic ulcer, site unspecified, with perforation: Secondary | ICD-10-CM | POA: Diagnosis present

## 2013-09-29 HISTORY — PX: LAPAROTOMY: SHX154

## 2013-09-29 HISTORY — PX: GASTRORRHAPHY: SHX6263

## 2013-09-29 LAB — COMPREHENSIVE METABOLIC PANEL
ALBUMIN: 3.5 g/dL (ref 3.5–5.2)
ALT: 10 U/L (ref 0–53)
AST: 17 U/L (ref 0–37)
Alkaline Phosphatase: 88 U/L (ref 39–117)
BUN: 9 mg/dL (ref 6–23)
CALCIUM: 8.4 mg/dL (ref 8.4–10.5)
CO2: 20 mEq/L (ref 19–32)
Chloride: 102 mEq/L (ref 96–112)
Creatinine, Ser: 1 mg/dL (ref 0.50–1.35)
GFR calc Af Amer: >90 mL/min (ref >90)
GFR calc non Af Amer: >90 mL/min (ref >90)
Glucose, Bld: 206 mg/dL — ABNORMAL HIGH (ref 70–99)
Potassium: 3.6 mEq/L — ABNORMAL LOW (ref 3.7–5.3)
Sodium: 136 mEq/L — ABNORMAL LOW (ref 137–147)
Total Bilirubin: 0.7 mg/dL (ref 0.3–1.2)
Total Protein: 7.2 g/dL (ref 6.0–8.3)

## 2013-09-29 LAB — CBC WITH DIFFERENTIAL/PLATELET
BASOS ABS: 0 10*3/uL (ref 0.0–0.1)
BASOS PCT: 0 % (ref 0–1)
EOS ABS: 0 10*3/uL (ref 0.0–0.7)
EOS PCT: 0 % (ref 0–5)
HCT: 42.3 % (ref 39.0–52.0)
Hemoglobin: 14.4 g/dL (ref 13.0–17.0)
Lymphocytes Relative: 20 % (ref 12–46)
Lymphs Abs: 1.2 10*3/uL (ref 0.7–4.0)
MCH: 30.5 pg (ref 26.0–34.0)
MCHC: 34 g/dL (ref 30.0–36.0)
MCV: 89.6 fL (ref 78.0–100.0)
Monocytes Absolute: 0.1 10*3/uL (ref 0.1–1.0)
Monocytes Relative: 2 % — ABNORMAL LOW (ref 3–12)
Neutro Abs: 4.5 10*3/uL (ref 1.7–7.7)
Neutrophils Relative %: 78 % — ABNORMAL HIGH (ref 43–77)
PLATELETS: 266 10*3/uL (ref 150–400)
RBC: 4.72 MIL/uL (ref 4.22–5.81)
RDW: 12.7 % (ref 11.5–15.5)
WBC: 5.7 10*3/uL (ref 4.0–10.5)

## 2013-09-29 LAB — LIPASE, BLOOD: Lipase: 16 U/L (ref 11–59)

## 2013-09-29 LAB — URINE MICROSCOPIC-ADD ON

## 2013-09-29 LAB — URINALYSIS, ROUTINE W REFLEX MICROSCOPIC
Bilirubin Urine: NEGATIVE
GLUCOSE, UA: NEGATIVE mg/dL
LEUKOCYTES UA: NEGATIVE
Nitrite: NEGATIVE
Protein, ur: NEGATIVE mg/dL
Specific Gravity, Urine: 1.025 (ref 1.005–1.030)
Urobilinogen, UA: 0.2 mg/dL (ref 0.0–1.0)
pH: 5.5 (ref 5.0–8.0)

## 2013-09-29 SURGERY — LAPAROTOMY, EXPLORATORY
Anesthesia: General | Site: Abdomen

## 2013-09-29 MED ORDER — POVIDONE-IODINE 10 % EX OINT
TOPICAL_OINTMENT | CUTANEOUS | Status: AC
  Filled 2013-09-29: qty 1

## 2013-09-29 MED ORDER — ONDANSETRON HCL 4 MG/2ML IJ SOLN
INTRAMUSCULAR | Status: AC
  Filled 2013-09-29: qty 2

## 2013-09-29 MED ORDER — PANTOPRAZOLE SODIUM 40 MG IV SOLR
40.0000 mg | INTRAVENOUS | Status: DC
  Administered 2013-09-29 – 2013-10-02 (×4): 40 mg via INTRAVENOUS
  Filled 2013-09-29 (×4): qty 40

## 2013-09-29 MED ORDER — ONDANSETRON HCL 4 MG PO TABS: 4.0000 mg | ORAL_TABLET | Freq: Four times a day (QID) | ORAL | Status: DC | PRN

## 2013-09-29 MED ORDER — SODIUM CHLORIDE BACTERIOSTATIC 0.9 % IJ SOLN
INTRAMUSCULAR | Status: AC
  Filled 2013-09-29: qty 20

## 2013-09-29 MED ORDER — SODIUM CHLORIDE 0.9 % IR SOLN
Status: DC | PRN
  Administered 2013-09-29: 2000 mL

## 2013-09-29 MED ORDER — PIPERACILLIN-TAZOBACTAM 3.375 G IVPB 30 MIN
3.3750 g | Freq: Once | INTRAVENOUS | Status: AC
  Administered 2013-09-29: 3.375 g via INTRAVENOUS
  Filled 2013-09-29 (×2): qty 50

## 2013-09-29 MED ORDER — GLYCOPYRROLATE 0.2 MG/ML IJ SOLN
INTRAMUSCULAR | Status: DC | PRN
  Administered 2013-09-29: .5 mg via INTRAVENOUS

## 2013-09-29 MED ORDER — POVIDONE-IODINE 10 % EX OINT
TOPICAL_OINTMENT | CUTANEOUS | Status: DC | PRN
  Administered 2013-09-29: 1 via TOPICAL

## 2013-09-29 MED ORDER — HYDROMORPHONE HCL PF 1 MG/ML IJ SOLN
INTRAMUSCULAR | Status: AC
  Filled 2013-09-29: qty 1

## 2013-09-29 MED ORDER — LACTATED RINGERS IV SOLN
INTRAVENOUS | Status: DC | PRN
  Administered 2013-09-29 (×2): via INTRAVENOUS

## 2013-09-29 MED ORDER — MIDAZOLAM HCL 5 MG/5ML IJ SOLN
INTRAMUSCULAR | Status: DC | PRN
  Administered 2013-09-29: 2 mg via INTRAVENOUS

## 2013-09-29 MED ORDER — LORAZEPAM 2 MG/ML IJ SOLN
1.0000 mg | INTRAMUSCULAR | Status: DC | PRN
  Administered 2013-10-02 (×2): 1 mg via INTRAVENOUS
  Filled 2013-09-29 (×3): qty 1

## 2013-09-29 MED ORDER — ARTIFICIAL TEARS OP OINT
TOPICAL_OINTMENT | OPHTHALMIC | Status: AC
  Filled 2013-09-29: qty 3.5

## 2013-09-29 MED ORDER — NICOTINE 21 MG/24HR TD PT24
21.0000 mg | MEDICATED_PATCH | Freq: Every day | TRANSDERMAL | Status: DC
  Administered 2013-09-29 – 2013-10-02 (×4): 21 mg via TRANSDERMAL
  Filled 2013-09-29 (×4): qty 1

## 2013-09-29 MED ORDER — ATROPINE SULFATE 0.4 MG/ML IJ SOLN
INTRAMUSCULAR | Status: AC
  Filled 2013-09-29: qty 1

## 2013-09-29 MED ORDER — FENTANYL CITRATE 0.05 MG/ML IJ SOLN
INTRAMUSCULAR | Status: DC | PRN
  Administered 2013-09-29 (×5): 50 ug via INTRAVENOUS

## 2013-09-29 MED ORDER — KETOROLAC TROMETHAMINE 30 MG/ML IJ SOLN
30.0000 mg | Freq: Once | INTRAMUSCULAR | Status: AC
  Administered 2013-09-29: 30 mg via INTRAVENOUS

## 2013-09-29 MED ORDER — ONDANSETRON HCL 4 MG/2ML IJ SOLN
4.0000 mg | Freq: Once | INTRAMUSCULAR | Status: AC
  Administered 2013-09-29: 4 mg via INTRAVENOUS
  Filled 2013-09-29: qty 2

## 2013-09-29 MED ORDER — HYDROMORPHONE HCL PF 1 MG/ML IJ SOLN
1.0000 mg | Freq: Once | INTRAMUSCULAR | Status: AC
  Administered 2013-09-29: 1 mg via INTRAVENOUS

## 2013-09-29 MED ORDER — HYDROMORPHONE HCL PF 1 MG/ML IJ SOLN
1.0000 mg | Freq: Once | INTRAMUSCULAR | Status: AC
  Administered 2013-09-29: 1 mg via INTRAVENOUS
  Filled 2013-09-29: qty 1

## 2013-09-29 MED ORDER — SODIUM CHLORIDE 0.9 % IV SOLN
1000.0000 mL | INTRAVENOUS | Status: DC
  Administered 2013-09-29: 1000 mL via INTRAVENOUS

## 2013-09-29 MED ORDER — ONDANSETRON HCL 4 MG/2ML IJ SOLN
INTRAMUSCULAR | Status: DC | PRN
  Administered 2013-09-29: 4 mg via INTRAVENOUS

## 2013-09-29 MED ORDER — LIDOCAINE HCL (PF) 1 % IJ SOLN
INTRAMUSCULAR | Status: AC
  Filled 2013-09-29: qty 5

## 2013-09-29 MED ORDER — PROPOFOL 10 MG/ML IV BOLUS
INTRAVENOUS | Status: DC | PRN
  Administered 2013-09-29: 50 mg via INTRAVENOUS

## 2013-09-29 MED ORDER — SUCCINYLCHOLINE CHLORIDE 20 MG/ML IJ SOLN
INTRAMUSCULAR | Status: AC
  Filled 2013-09-29: qty 1

## 2013-09-29 MED ORDER — LIDOCAINE HCL 1 % IJ SOLN
INTRAMUSCULAR | Status: DC | PRN
  Administered 2013-09-29: 40 mg via INTRADERMAL

## 2013-09-29 MED ORDER — ROCURONIUM BROMIDE 100 MG/10ML IV SOLN
INTRAVENOUS | Status: DC | PRN
  Administered 2013-09-29: 35 mg via INTRAVENOUS
  Administered 2013-09-29: 5 mg via INTRAVENOUS
  Administered 2013-09-29: 10 mg via INTRAVENOUS

## 2013-09-29 MED ORDER — NEOSTIGMINE METHYLSULFATE 1 MG/ML IJ SOLN
INTRAMUSCULAR | Status: DC | PRN
  Administered 2013-09-29: 3 mg via INTRAVENOUS

## 2013-09-29 MED ORDER — KETOROLAC TROMETHAMINE 30 MG/ML IJ SOLN
INTRAMUSCULAR | Status: AC
Start: 2013-09-29 — End: 2013-09-29
  Filled 2013-09-29: qty 1

## 2013-09-29 MED ORDER — ROCURONIUM BROMIDE 50 MG/5ML IV SOLN
INTRAVENOUS | Status: AC
  Filled 2013-09-29: qty 1

## 2013-09-29 MED ORDER — SODIUM CHLORIDE 0.9 % IV BOLUS (SEPSIS): 1000.0000 mL | Freq: Once | INTRAVENOUS | Status: DC

## 2013-09-29 MED ORDER — LACTATED RINGERS IV SOLN
INTRAVENOUS | Status: DC
  Administered 2013-09-29 – 2013-09-30 (×2): via INTRAVENOUS

## 2013-09-29 MED ORDER — HYDROMORPHONE HCL PF 1 MG/ML IJ SOLN
1.0000 mg | Freq: Once | INTRAMUSCULAR | Status: DC
  Filled 2013-09-29: qty 1

## 2013-09-29 MED ORDER — PIPERACILLIN-TAZOBACTAM 3.375 G IVPB
3.3750 g | Freq: Three times a day (TID) | INTRAVENOUS | Status: DC
  Administered 2013-09-29 – 2013-10-02 (×11): 3.375 g via INTRAVENOUS
  Filled 2013-09-29 (×13): qty 50

## 2013-09-29 MED ORDER — HYDROMORPHONE HCL PF 1 MG/ML IJ SOLN
1.0000 mg | INTRAMUSCULAR | Status: DC | PRN
  Administered 2013-09-29 – 2013-10-02 (×19): 1 mg via INTRAVENOUS
  Filled 2013-09-29 (×17): qty 1

## 2013-09-29 MED ORDER — PHENOL 1.4 % MT LIQD
1.0000 | OROMUCOSAL | Status: DC | PRN
  Administered 2013-09-29 – 2013-10-01 (×4): 1 via OROMUCOSAL
  Filled 2013-09-29: qty 177

## 2013-09-29 MED ORDER — ENOXAPARIN SODIUM 40 MG/0.4ML ~~LOC~~ SOLN
40.0000 mg | SUBCUTANEOUS | Status: DC
  Administered 2013-09-30 – 2013-10-02 (×3): 40 mg via SUBCUTANEOUS
  Filled 2013-09-29 (×3): qty 0.4

## 2013-09-29 MED ORDER — GLYCOPYRROLATE 0.2 MG/ML IJ SOLN
INTRAMUSCULAR | Status: AC
  Filled 2013-09-29: qty 2

## 2013-09-29 MED ORDER — ONDANSETRON HCL 4 MG/2ML IJ SOLN: 4.0000 mg | Freq: Four times a day (QID) | INTRAMUSCULAR | Status: DC | PRN

## 2013-09-29 MED ORDER — PANTOPRAZOLE SODIUM 40 MG IV SOLR
40.0000 mg | Freq: Once | INTRAVENOUS | Status: AC
  Administered 2013-09-29: 40 mg via INTRAVENOUS
  Filled 2013-09-29: qty 40

## 2013-09-29 MED ORDER — ACETAMINOPHEN 325 MG RE SUPP
325.0000 mg | RECTAL | Status: DC | PRN
  Administered 2013-09-29 – 2013-10-01 (×3): 325 mg via RECTAL
  Filled 2013-09-29 (×4): qty 1

## 2013-09-29 MED ORDER — PROPOFOL 10 MG/ML IV EMUL
INTRAVENOUS | Status: AC
  Filled 2013-09-29: qty 20

## 2013-09-29 MED ORDER — FENTANYL CITRATE 0.05 MG/ML IJ SOLN
INTRAMUSCULAR | Status: AC
  Filled 2013-09-29: qty 5

## 2013-09-29 MED ORDER — EPHEDRINE SULFATE 50 MG/ML IJ SOLN
INTRAMUSCULAR | Status: AC
  Filled 2013-09-29: qty 1

## 2013-09-29 MED ORDER — SODIUM CHLORIDE 0.9 % IV SOLN
1000.0000 mL | Freq: Once | INTRAVENOUS | Status: AC
  Administered 2013-09-29: 1000 mL via INTRAVENOUS

## 2013-09-29 MED ORDER — MIDAZOLAM HCL 2 MG/2ML IJ SOLN
INTRAMUSCULAR | Status: AC
  Filled 2013-09-29: qty 2

## 2013-09-29 MED ORDER — PHENYLEPHRINE HCL 10 MG/ML IJ SOLN
INTRAMUSCULAR | Status: AC
  Filled 2013-09-29: qty 1

## 2013-09-29 MED ORDER — SUCCINYLCHOLINE CHLORIDE 20 MG/ML IJ SOLN
INTRAMUSCULAR | Status: DC | PRN
  Administered 2013-09-29: 120 mg via INTRAVENOUS

## 2013-09-29 MED ORDER — KETOROLAC TROMETHAMINE 30 MG/ML IJ SOLN
INTRAMUSCULAR | Status: AC
  Filled 2013-09-29: qty 1

## 2013-09-29 MED ORDER — MENTHOL 3 MG MT LOZG
1.0000 | LOZENGE | OROMUCOSAL | Status: DC | PRN
  Administered 2013-09-29 – 2013-10-01 (×3): 3 mg via ORAL
  Filled 2013-09-29 (×4): qty 9

## 2013-09-29 SURGICAL SUPPLY — 68 items
APPLIER CLIP 11 MED OPEN (CLIP)
APPLIER CLIP 13 LRG OPEN (CLIP)
APR CLP LRG 13 20 CLIP (CLIP)
APR CLP MED 11 20 MLT OPN (CLIP)
BAG HAMPER (MISCELLANEOUS) ×3 IMPLANT
BARRIER SKIN 2 3/4 (OSTOMY) IMPLANT
BARRIER SKIN 2 3/4 INCH (OSTOMY)
BARRIER SKIN OD2.25 2 3/4 FLNG (OSTOMY) IMPLANT
BRR SKN FLT 2.75X2.25 2 PC (OSTOMY)
CHLORAPREP W/TINT 26ML (MISCELLANEOUS) ×3 IMPLANT
CLAMP POUCH DRAINAGE QUIET (OSTOMY) IMPLANT
CLIP APPLIE 11 MED OPEN (CLIP) IMPLANT
CLIP APPLIE 13 LRG OPEN (CLIP) IMPLANT
CLOTH BEACON ORANGE TIMEOUT ST (SAFETY) ×3 IMPLANT
COVER LIGHT HANDLE STERIS (MISCELLANEOUS) ×6 IMPLANT
DRAPE WARM FLUID 44X44 (DRAPE) ×3 IMPLANT
DRSG OPSITE POSTOP 4X10 (GAUZE/BANDAGES/DRESSINGS) ×1 IMPLANT
ELECT BLADE 6 FLAT ULTRCLN (ELECTRODE) IMPLANT
ELECT REM PT RETURN 9FT ADLT (ELECTROSURGICAL) ×3
ELECTRODE REM PT RTRN 9FT ADLT (ELECTROSURGICAL) ×1 IMPLANT
EVACUATOR DRAINAGE 10X20 100CC (DRAIN) IMPLANT
EVACUATOR SILICONE 100CC (DRAIN) ×3
GLOVE BIO SURGEON STRL SZ7.5 (GLOVE) ×6 IMPLANT
GLOVE BIOGEL PI IND STRL 7.0 (GLOVE) IMPLANT
GLOVE BIOGEL PI IND STRL 7.5 (GLOVE) IMPLANT
GLOVE BIOGEL PI INDICATOR 7.0 (GLOVE) ×2
GLOVE BIOGEL PI INDICATOR 7.5 (GLOVE) ×8
GLOVE ECLIPSE 6.5 STRL STRAW (GLOVE) ×2 IMPLANT
GLOVE ECLIPSE 7.0 STRL STRAW (GLOVE) ×2 IMPLANT
GOWN STRL REUS W/TWL LRG LVL3 (GOWN DISPOSABLE) ×11 IMPLANT
INST SET MAJOR GENERAL (KITS) ×3 IMPLANT
KIT REMOVER STAPLE SKIN (MISCELLANEOUS) IMPLANT
KIT ROOM TURNOVER APOR (KITS) ×3 IMPLANT
LIGASURE IMPACT 36 18CM CVD LR (INSTRUMENTS) IMPLANT
MANIFOLD NEPTUNE II (INSTRUMENTS) ×3 IMPLANT
NS IRRIG 1000ML POUR BTL (IV SOLUTION) ×5 IMPLANT
PACK ABDOMINAL MAJOR (CUSTOM PROCEDURE TRAY) ×3 IMPLANT
PAD ARMBOARD 7.5X6 YLW CONV (MISCELLANEOUS) ×3 IMPLANT
POUCH OSTOMY 2 3/4  H 3804 (WOUND CARE)
POUCH OSTOMY 2 3/4 H 3804 (WOUND CARE)
POUCH OSTOMY 2 PC DRNBL 2.75 (WOUND CARE) IMPLANT
RELOAD LINEAR CUT PROX 55 BLUE (ENDOMECHANICALS) IMPLANT
RELOAD PROXIMATE 75MM BLUE (ENDOMECHANICALS) IMPLANT
RELOAD STAPLE 55 3.8 BLU REG (ENDOMECHANICALS) IMPLANT
RELOAD STAPLE 75 3.8 BLU REG (ENDOMECHANICALS) IMPLANT
RETRACTOR WND ALEXIS 25 LRG (MISCELLANEOUS) IMPLANT
RTRCTR WOUND ALEXIS 25CM LRG (MISCELLANEOUS)
SET BASIN LINEN APH (SET/KITS/TRAYS/PACK) ×3 IMPLANT
SPONGE GAUZE 4X4 12PLY (GAUZE/BANDAGES/DRESSINGS) ×3 IMPLANT
SPONGE LAP 18X18 X RAY DECT (DISPOSABLE) ×3 IMPLANT
STAPLER GUN LINEAR PROX 60 (STAPLE) IMPLANT
STAPLER PROXIMATE 55 BLUE (STAPLE) IMPLANT
STAPLER PROXIMATE 75MM BLUE (STAPLE) IMPLANT
STAPLER VISISTAT (STAPLE) ×3 IMPLANT
SUCTION POOLE TIP (SUCTIONS) ×3 IMPLANT
SUT CHROMIC 0 SH (SUTURE) IMPLANT
SUT CHROMIC 2 0 SH (SUTURE) IMPLANT
SUT CHROMIC 3 0 SH 27 (SUTURE) ×1 IMPLANT
SUT ETHILON 3 0 FSL (SUTURE) ×2 IMPLANT
SUT NOVA NAB GS-26 0 60 (SUTURE) ×2 IMPLANT
SUT PDS AB CT VIOLET #0 27IN (SUTURE) ×4 IMPLANT
SUT PROLENE 2 0 SH 30 (SUTURE) ×1 IMPLANT
SUT SILK 2 0 (SUTURE)
SUT SILK 2 0 REEL (SUTURE) ×1 IMPLANT
SUT SILK 2-0 18XBRD TIE 12 (SUTURE) ×1 IMPLANT
SUT SILK 3 0 SH CR/8 (SUTURE) ×2 IMPLANT
TAPE CLOTH SURG 4X10 WHT LF (GAUZE/BANDAGES/DRESSINGS) ×2 IMPLANT
TRAY FOLEY CATH 16FR SILVER (SET/KITS/TRAYS/PACK) ×3 IMPLANT

## 2013-09-29 NOTE — Op Note (Signed)
Patient:  [name]  DOB:  04/04/1976  MRN:  [id number]   Preop Diagnosis:  Perforated viscus  Postop Diagnosis:  Same, perforated peptic ulcer  Procedure:  Exploratory laparotomy, gastrorrhaphy  Surgeon:  Franky MachoMark Caylei Sperry, M.D.  Anes:  General endotracheal  Indications:  Patient is a 38 year old black male with a history of reflux disease who presented emergency room twice yesterday evening was done on the second CAT scan of the abdomen to have pneumoperitoneum. The risks and benefits of the procedure including bleeding, infection, cardiopulmonary difficulties were fully explained to the patient, who gave informed consent.  Procedure note:  The patient is placed the supine position. After induction of general endotracheal anesthesia, the abdomen was prepped and draped using usual sterile technique with DuraPrep. Surgical site confirmation was performed.  An upper midline incision was made down to the fascia. The peritoneal cavity was entered into without difficulty. A moderate amount of cloudy fluid was noted. This was evacuated without difficulty. On exploration of the upper abdomen, a 5-7 mm anterior prepyloric perforation was noted. An area of thickened gastric wall was noted circumferentially. A doubled Graham plication was then performed using both lesser curve adipose tissue as well as the greater omentum. A #10 flat Jackson-Pratt drain was placed into this region and brought through a separate stab wound inferior to the incision line. It was secured at the skin level using 3-0 nylon interrupted suture. The abdominal cavity was then copiously irrigated normal saline. All fluid was then evacuated from the abdominal cavity. The fascia was reapproximated using a 0 PDS running suture. The subcutaneous layer was irrigated normal saline and the skin was closed using staples. Betadine ointment and dry sterile dressings were applied.  All tape and needle counts were correct at the end of the  procedure. Patient was extubated in the operating room and transferred to PACU in stable condition. Complications:  None  EBL:  Minimal  Specimen:  None  Drains: JP drain to epigastric region

## 2013-09-29 NOTE — Addendum Note (Signed)
Addendum created 09/29/13 [id number] by Moshe SalisburyKaren E Elzina Devera, CRNA   Modules edited: Anesthesia Medication Administration

## 2013-09-29 NOTE — Anesthesia Preprocedure Evaluation (Addendum)
Anesthesia Evaluation  Patient identified by MRN, date of birth, ID band Patient awake    Reviewed: Allergy & Precautions, H&P , NPO status , Patient's Chart, lab work & pertinent test results  Airway Mallampati: II TM Distance: >3 FB Neck ROM: Full    Dental  (+) Teeth Intact   Pulmonary Current Smoker,    + decreased breath sounds      Cardiovascular Rhythm:Regular Rate:Normal     Neuro/Psych    GI/Hepatic PUD, GERD-  Medicated,  Endo/Other    Renal/GU      Musculoskeletal   Abdominal (+)  Abdomen: rigid and tender.    Peds  Hematology   Anesthesia Other Findings   Reproductive/Obstetrics                          Anesthesia Physical Anesthesia Plan  ASA: II and emergent  Anesthesia Plan: General   Post-op Pain Management:    Induction: Intravenous and Rapid sequence  Airway Management Planned: Oral ETT  Additional Equipment:   Intra-op Plan:   Post-operative Plan: Extubation in OR  Informed Consent: I have reviewed the patients History and Physical, chart, labs and discussed the procedure including the risks, benefits and alternatives for the proposed anesthesia with the patient or authorized representative who has indicated his/her understanding and acceptance.     Plan Discussed with: Anesthesiologist  Anesthesia Plan Comments: (Telephone consult with Dr. Jayme CloudGonzalez.)        Anesthesia Quick Evaluation

## 2013-09-29 NOTE — Anesthesia Procedure Notes (Signed)
Procedure Name: Intubation Date/Time: 09/29/2013 8:03 AM Performed by: Glynn OctaveANIEL, Jeffrey Hylton E Pre-anesthesia Checklist: Patient identified, Patient being monitored, Timeout performed, Emergency Drugs available and Suction available Patient Re-evaluated:Patient Re-evaluated prior to inductionOxygen Delivery Method: Circle System Utilized Preoxygenation: Pre-oxygenation with 100% oxygen Intubation Type: IV induction, Rapid sequence and Cricoid Pressure applied Ventilation: Mask ventilation without difficulty Laryngoscope Size: Mac and 3 Grade View: Grade I Tube type: Oral Tube size: 8.0 mm Number of attempts: 1 Airway Equipment and Method: stylet Placement Confirmation: ETT inserted through vocal cords under direct vision,  positive ETCO2 and breath sounds checked- equal and bilateral Secured at: 22 cm Tube secured with: Tape Dental Injury: Teeth and Oropharynx as per pre-operative assessment
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
## 2013-09-29 NOTE — ED Notes (Signed)
SCDs applied. PT to OR.

## 2013-09-29 NOTE — Transfer of Care (Signed)
Immediate Anesthesia Transfer of Care Note  Patient: [name]  Procedure(s) Performed: Procedure(s): EXPLORATORY LAPAROTOMY (N/A)  Patient Location: PACU  Anesthesia Type:General  Level of Consciousness: awake  Airway & Oxygen Therapy: Patient Spontanous Breathing and Patient connected to face mask oxygen  Post-op Assessment: Report given to PACU RN  Post vital signs: Reviewed and stable  Complications: No apparent anesthesia complications

## 2013-09-29 NOTE — ED Notes (Signed)
AC notified of SCDs

## 2013-09-29 NOTE — ED Notes (Signed)
dozing 

## 2013-09-29 NOTE — ED Notes (Signed)
sleeping

## 2013-09-29 NOTE — H&P (Signed)
[name] is an 38 y.o. male.   Chief Complaint: Epigastric pain HPI: This is a 38 year old black male who presented emergency room with worsening abdominal pain. Initial CT scan the abdomen revealed enteritis with a small amount of free fluid. He was discharged home but returned soon afterwards with worsening abdominal pain. Repeat CT scan the abdomen revealed pneumoperitoneum, consistent with perforated viscus. Patient does have a long-standing history of reflux disease. He denies any recent alcohol use. He denies any aspirin use or nonsteroidal anti-inflammatory use.  Past Medical History  Diagnosis Date  . GERD (gastroesophageal reflux disease)     Past Surgical History  Procedure Laterality Date  . None    . Esophagogastroduodenoscopy (egd) with propofol N/A 03/19/2013    Procedure: ESOPHAGOGASTRODUODENOSCOPY (EGD) WITH PROPOFOL;  Surgeon: Danie Binder, MD;  Location: [facility];  Service: Endoscopy;  Laterality: N/A;  . Esophageal biopsy N/A 03/19/2013    Procedure: Gastric Biopsies;  Surgeon: Danie Binder, MD;  Location: [facility];  Service: Endoscopy;  Laterality: N/A;    Family History  Problem Relation Age of Onset  . Colon cancer Neg Hx   . Kidney failure Father    Social History:  reports that he has been smoking Cigarettes.  He has a 7.5 pack-year smoking history. He has never used smokeless tobacco. He reports that he drinks alcohol. He reports that he uses illicit drugs (Marijuana).  Allergies:  Allergies  Allergen Reactions  . Contrast Media [Iodinated Diagnostic Agents] Hives and Itching     (Not in a hospital admission)  Results for orders placed during the hospital encounter of 09/28/13 (from the past 48 hour(s))  CBC WITH DIFFERENTIAL     Status: Abnormal   Collection Time    09/29/13 12:29 AM      Result Value Ref Range   WBC 5.7  4.0 - 10.5 K/uL   RBC 4.72  4.22 - 5.81 MIL/uL   Hemoglobin 14.4  13.0 - 17.0 g/dL   HCT 42.3  39.0 - 52.0 %   MCV 89.6   78.0 - 100.0 fL   MCH 30.5  26.0 - 34.0 pg   MCHC 34.0  30.0 - 36.0 g/dL   RDW 12.7  11.5 - 15.5 %   Platelets 266  150 - 400 K/uL   Neutrophils Relative % 78 (*) 43 - 77 %   Neutro Abs 4.5  1.7 - 7.7 K/uL   Lymphocytes Relative 20  12 - 46 %   Lymphs Abs 1.2  0.7 - 4.0 K/uL   Monocytes Relative 2 (*) 3 - 12 %   Monocytes Absolute 0.1  0.1 - 1.0 K/uL   Eosinophils Relative 0  0 - 5 %   Eosinophils Absolute 0.0  0.0 - 0.7 K/uL   Basophils Relative 0  0 - 1 %   Basophils Absolute 0.0  0.0 - 0.1 K/uL  COMPREHENSIVE METABOLIC PANEL     Status: Abnormal   Collection Time    09/29/13 12:29 AM      Result Value Ref Range   Sodium 136 (*) 137 - 147 mEq/L   Potassium 3.6 (*) 3.7 - 5.3 mEq/L   Chloride 102  96 - 112 mEq/L   CO2 20  19 - 32 mEq/L   Glucose, Bld 206 (*) 70 - 99 mg/dL   BUN 9  6 - 23 mg/dL   Creatinine, Ser 1.00  0.50 - 1.35 mg/dL   Calcium 8.4  8.4 - 10.5 mg/dL  Total Protein 7.2  6.0 - 8.3 g/dL   Albumin 3.5  3.5 - 5.2 g/dL   AST 17  0 - 37 U/L   ALT 10  0 - 53 U/L   Alkaline Phosphatase 88  39 - 117 U/L   Total Bilirubin 0.7  0.3 - 1.2 mg/dL   GFR calc non Af Amer >90  >90 mL/min   GFR calc Af Amer >90  >90 mL/min   Comment: (NOTE)     The eGFR has been calculated using the CKD EPI equation.     This calculation has not been validated in all clinical situations.     eGFR's persistently <90 mL/min signify possible Chronic Kidney     Disease.  LIPASE, BLOOD     Status: None   Collection Time    09/29/13 12:29 AM      Result Value Ref Range   Lipase 16  11 - 59 U/L  URINALYSIS, ROUTINE W REFLEX MICROSCOPIC     Status: Abnormal   Collection Time    09/29/13  2:40 AM      Result Value Ref Range   Color, Urine YELLOW  YELLOW   APPearance CLEAR  CLEAR   Specific Gravity, Urine 1.025  1.005 - 1.030   pH 5.5  5.0 - 8.0   Glucose, UA NEGATIVE  NEGATIVE mg/dL   Hgb urine dipstick TRACE (*) NEGATIVE   Bilirubin Urine NEGATIVE  NEGATIVE   Ketones, ur TRACE (*)  NEGATIVE mg/dL   Protein, ur NEGATIVE  NEGATIVE mg/dL   Urobilinogen, UA 0.2  0.0 - 1.0 mg/dL   Nitrite NEGATIVE  NEGATIVE   Leukocytes, UA NEGATIVE  NEGATIVE  URINE MICROSCOPIC-ADD ON     Status: None   Collection Time    09/29/13  2:40 AM      Result Value Ref Range   Squamous Epithelial / LPF RARE  RARE   WBC, UA 0-2  <3 WBC/hpf   RBC / HPF 0-2  <3 RBC/hpf   Bacteria, UA RARE  RARE   Ct Abdomen Pelvis Wo Contrast  09/29/2013   CLINICAL DATA:  Severe generalized abdominal pain  EXAM: CT ABDOMEN AND PELVIS WITHOUT CONTRAST  TECHNIQUE: Multidetector CT imaging of the abdomen and pelvis was performed following the standard protocol without intravenous contrast.  COMPARISON:  Prior CT from 09/28/2013  FINDINGS: Small bilateral pleural effusions with associated bibasilar atelectasis is present.  The liver demonstrates a normal unenhanced appearance. Secrete contrast material present within the gallbladder lumen. The gallbladder is otherwise unremarkable. No biliary ductal dilatation. The spleen, adrenal glands, and pancreas are grossly normal.  Secretory contrast material present within the renal collecting systems bilaterally. Kidneys are unremarkable without evidence of nephrolithiasis or hydronephrosis.  Scattered foci of free intraperitoneal air now seen within the upper abdomen, anterior to the left hepatic lobe (series 2, image 23), as well as within the left upper quadrant. Scattered foci of gas seen within the porta hepatis and along the fissure for ligamentum teres. Few foci of gas seen tracking within the wall of the greater curvature of the stomach (series 2, image 16). No definite portal venous gas. Previously described thickened loops of small bowel not as well appreciated on this noncontrast examination, but are grossly similar. Free fluid within the lower pelvis has increased relative to prior.  Excrete contrast material present within the bladder lumen. The bladder is otherwise  unremarkable. Prostate is within normal limits.  Bullet fragment again noted anterior to the left  femur. No acute osseous abnormality.  IMPRESSION: 1. Interval development of free intraperitoneal air within the upper abdomen, compatible with perforated viscus. The exact source of of the free air is not definitely identified, however, possible perforated ulcer (gastric or duodenal) could be considered given the distribution of gas within the upper or peritoneal cavity. 2. Interval increase in moderate volume ascites. 3. Small bilateral pleural effusions with associated atelectasis.  Critical Value/emergent results were called by telephone at the time of interpretation on 09/29/2013 at 5:51 AM to Dr. Delora Fuel , who verbally acknowledged these results.   Electronically Signed   By: Jeannine Boga M.D.   On: 09/29/2013 06:13   Ct Abdomen Pelvis W Contrast  09/28/2013   CLINICAL DATA:  Left flank pain.  Epigastric pain.  EXAM: CT ABDOMEN AND PELVIS WITH CONTRAST  TECHNIQUE: Multidetector CT imaging of the abdomen and pelvis was performed using the standard protocol following bolus administration of intravenous contrast.  CONTRAST:  50 mL OMNIPAQUE IOHEXOL 300 MG/ML SOLN, 100 mL OMNIPAQUE IOHEXOL 300 MG/ML SOLN  COMPARISON:  CT abdomen and pelvis 02/06/2013.  FINDINGS: The lung bases are clear.  No pleural or pericardial effusion.  The gallbladder, liver, spleen, adrenal glands, pancreas and kidneys appear normal. There is a small volume of free pelvic fluid. The appendix and colon appear normal. The stomach is unremarkable. Multiple fluid-filled loops of small bowel are identified and demonstrate mild wall thickening without dilatation. There is no pneumatosis, portal venous gas, focal fluid collection or free intraperitoneal air. No lymphadenopathy is identified.  Bullet fragment projecting anterior to the right femur is identified. Irregularity of the posterior right acetabulum and inferior aspect of the  right femoral head is likely posttraumatic. The appearance is unchanged.  IMPRESSION: Small volume of free pelvic fluid with mild bowel wall thickening and multiple fluid-filled loops most consistent with infectious or inflammatory enteritis. The terminal ileum appears normal.   Electronically Signed   By: Inge Rise M.D.   On: 09/28/2013 21:15    Review of Systems  Constitutional: Positive for malaise/fatigue.  HENT: Negative.   Eyes: Negative.   Respiratory: Negative.   Cardiovascular: Negative.   Gastrointestinal: Positive for abdominal pain.  Genitourinary: Negative.   Musculoskeletal: Negative.   Skin: Negative.   All other systems reviewed and are negative.   Blood pressure 125/77, pulse 80, temperature 98.8 F (37.1 C), resp. rate 16, height '5\' 11"'  (1.803 m), weight 78.019 kg (172 lb), SpO2 97.00%. Physical Exam  Vitals reviewed. Constitutional: He is oriented to person, place, and time. He appears well-developed and well-nourished.  HENT:  Head: Normocephalic and atraumatic.  Neck: Normal range of motion. Neck supple.  Cardiovascular: Normal rate, regular rhythm and normal heart sounds.   Respiratory: Effort normal and breath sounds normal.  GI: There is tenderness. There is rebound and guarding.  Patient has rigid abdomen to palpation.  Neurological: He is alert and oriented to person, place, and time.  Skin: Skin is warm and dry.     Assessment/Plan Impression: Perforated viscus, probable perforated peptic ulcer Plan: Patient be taken emergently to the operating room for an exploratory laparotomy. The risks and benefits of the procedure were fully explained to the patient, who gave informed consent.  Jamesetta So 09/29/2013, 7:20 AM

## 2013-09-29 NOTE — Anesthesia Postprocedure Evaluation (Signed)
  Anesthesia Post-op Note  Patient: [name]  Procedure(s) Performed: Procedure(s): EXPLORATORY LAPAROTOMY (N/A)  Patient Location: PACU  Anesthesia Type:General  Level of Consciousness: awake, alert  and oriented  Airway and Oxygen Therapy: Patient Spontanous Breathing and Patient connected to face mask oxygen  Post-op Pain: mild  Post-op Assessment: Post-op Vital signs reviewed, Patient's Cardiovascular Status Stable, Respiratory Function Stable, Patent Airway and No signs of Nausea or vomiting  Post-op Vital Signs: Reviewed and stable  Last Vitals:  Filed Vitals:   09/29/13 0744  BP: 133/77  Pulse: 70  Temp:   Resp: 16    Complications: No apparent anesthesia complications

## 2013-09-29 NOTE — ED Notes (Signed)
Writhing increased, now vomiting, profusely diaphoretic. Dr. Preston FleetingGlick to see

## 2013-09-29 NOTE — Progress Notes (Signed)
09/29/13 2008 Patient requested "can I get something to help me sleep?" also asked for heat pack to abdominal incision area, stated ice pack did not seem to help. States abdominal pain decreased with dilaudid as ordered PRN pain. C/o sore throat. Offered Cepacol lozenge as ordered. Notified Dr. Lovell SheehanJenkins. Stated okay for heat pack as needed. Orders placed for ativan PRN anxiety to help him rest and chloraseptic throat spray PRN in case not relieved with lozenges. Earnstine RegalAshley Shardai Star, RN

## 2013-09-29 NOTE — ED Notes (Signed)
Suddenly awake sitting upright and rocking with right lower abd pain. Dr Preston Fleetingglick aware and pt medicated

## 2013-09-30 ENCOUNTER — Encounter (HOSPITAL_COMMUNITY): Payer: Self-pay

## 2013-09-30 LAB — BASIC METABOLIC PANEL
BUN: 13 mg/dL (ref 6–23)
CHLORIDE: 104 meq/L (ref 96–112)
CO2: 25 meq/L (ref 19–32)
CREATININE: 1.42 mg/dL — AB (ref 0.50–1.35)
Calcium: 8.5 mg/dL (ref 8.4–10.5)
GFR calc Af Amer: 72 mL/min — ABNORMAL LOW (ref >90)
GFR calc non Af Amer: 62 mL/min — ABNORMAL LOW (ref >90)
GLUCOSE: 112 mg/dL — AB (ref 70–99)
POTASSIUM: 3.7 meq/L (ref 3.7–5.3)
Sodium: 141 mEq/L (ref 137–147)

## 2013-09-30 LAB — PHOSPHORUS: Phosphorus: 1.9 mg/dL — ABNORMAL LOW (ref 2.3–4.6)

## 2013-09-30 LAB — CBC
HCT: 37.8 % — ABNORMAL LOW (ref 39.0–52.0)
HEMOGLOBIN: 12.9 g/dL — AB (ref 13.0–17.0)
MCH: 30.9 pg (ref 26.0–34.0)
MCHC: 34.1 g/dL (ref 30.0–36.0)
MCV: 90.4 fL (ref 78.0–100.0)
PLATELETS: 234 10*3/uL (ref 150–400)
RBC: 4.18 MIL/uL — ABNORMAL LOW (ref 4.22–5.81)
RDW: 13.2 % (ref 11.5–15.5)
WBC: 15.6 10*3/uL — AB (ref 4.0–10.5)

## 2013-09-30 LAB — MAGNESIUM: Magnesium: 1.4 mg/dL — ABNORMAL LOW (ref 1.5–2.5)

## 2013-09-30 MED ORDER — KETOROLAC TROMETHAMINE 30 MG/ML IJ SOLN
30.0000 mg | Freq: Four times a day (QID) | INTRAMUSCULAR | Status: DC | PRN
  Administered 2013-10-01 (×2): 30 mg via INTRAVENOUS
  Filled 2013-09-30 (×2): qty 1

## 2013-09-30 MED ORDER — KETOROLAC TROMETHAMINE 30 MG/ML IJ SOLN
30.0000 mg | Freq: Four times a day (QID) | INTRAMUSCULAR | Status: DC
  Filled 2013-09-30: qty 1

## 2013-09-30 MED ORDER — SODIUM CHLORIDE 0.9 % IV BOLUS (SEPSIS)
1000.0000 mL | Freq: Once | INTRAVENOUS | Status: AC
  Administered 2013-09-30: 1000 mL via INTRAVENOUS

## 2013-09-30 NOTE — Plan of Care (Signed)
Dr. Lovell SheehanJenkins called to inform of pt temp 103.55F.  Tylenol given, no further orders at this time.

## 2013-09-30 NOTE — Plan of Care (Signed)
Problem: Phase II Progression Outcomes Goal: Progressing with IS, TCDB Outcome: Progressing 09/30/13 0251 Discussed importance of coughing, deep breathing, incentive spirometry every 1-2 hours while awake with patient during assessment. Pt demonstrates correct use of pillow support to splint abdominal area when repositioning, coughing. Demonstrates correct use of incentive spirometer. Consistently reaching 750 ml volume, goal of 1500 ml. Patient verbalized understanding of rationale for use. Earnstine RegalAshley Katera Rybka, RN

## 2013-09-30 NOTE — Addendum Note (Signed)
Addendum created 09/30/13 0800 by Davine Coba E Zykerria Tanton, CRNA   Modules edited: Anesthesia Responsible Staff    

## 2013-09-30 NOTE — Plan of Care (Signed)
Dr. Lovell SheehanJenkins informed of pt continued fever of 101.9.  Toradol and blood cultures ordered.

## 2013-09-30 NOTE — Addendum Note (Signed)
Addendum created 09/30/13 1221 by Earleen NewportAmy A Adams, CRNA   Modules edited: Notes Section   Notes Section:  File: [id number]

## 2013-09-30 NOTE — Anesthesia Postprocedure Evaluation (Signed)
  Anesthesia Post-op Note  Patient: [name]  Procedure(s) Performed: Procedure(s): EXPLORATORY LAPAROTOMY (N/A) GASTRORRHAPHY (N/A)  Patient Location: Room 318  Anesthesia Type:General  Level of Consciousness: awake, alert , oriented and patient cooperative  Airway and Oxygen Therapy: Patient Spontanous Breathing  Post-op Pain: mild  Post-op Assessment: Post-op Vital signs reviewed, Patient's Cardiovascular Status Stable, Respiratory Function Stable, Patent Airway, No signs of Nausea or vomiting and Pain level controlled  Post-op Vital Signs: Reviewed and stable  Last Vitals:  Filed Vitals:   09/30/13 0450  BP: 142/84  Pulse: 110  Temp: 37.6 C  Resp: 20    Complications: No apparent anesthesia complications

## 2013-09-30 NOTE — Progress Notes (Signed)
1 Day Post-Op  Subjective: Complaints of throat pain from NG tube.  Objective: Vital signs in last 24 hours: Temp:  [98.2 F (36.8 C)-100.7 F (38.2 C)] 99.7 F (37.6 C) (04/27 0450) Pulse Rate:  [80-110] 110 (04/27 0450) Resp:  [20] 20 (04/27 0450) BP: (115-142)/(59-84) 142/84 mmHg (04/27 0450) SpO2:  [92 %-98 %] 92 % (04/27 0450) Last BM Date: 09/28/13  Intake/Output from previous day: 04/26 0701 - 04/27 0700 In: 2602.9 [P.O.:30; I.V.:2472.9; IV Piggyback:100] Out: 1100 [Urine:850; Emesis/NG output:200; Drains:50] Intake/Output this shift:    General appearance: alert, cooperative and no distress Resp: clear to auscultation bilaterally Cardio: regular rate and rhythm, S1, S2 normal, no murmur, click, rub or gallop GI: Soft. JP drainage serosanguineous in nature. Incision clean and dry.  Lab Results:   Recent Labs  09/29/13 0029 09/30/13 0540  WBC 5.7 15.6*  HGB 14.4 12.9*  HCT 42.3 37.8*  PLT 266 234   BMET  Recent Labs  09/29/13 0029 09/30/13 0540  NA 136* 141  K 3.6* 3.7  CL 102 104  CO2 20 25  GLUCOSE 206* 112*  BUN 9 13  CREATININE 1.00 1.42*  CALCIUM 8.4 8.5   PT/INR No results found for this basename: LABPROT, INR,  in the last 72 hours  Studies/Results: Ct Abdomen Pelvis Wo Contrast  09/29/2013   CLINICAL DATA:  Severe generalized abdominal pain  EXAM: CT ABDOMEN AND PELVIS WITHOUT CONTRAST  TECHNIQUE: Multidetector CT imaging of the abdomen and pelvis was performed following the standard protocol without intravenous contrast.  COMPARISON:  Prior CT from 09/28/2013  FINDINGS: Small bilateral pleural effusions with associated bibasilar atelectasis is present.  The liver demonstrates a normal unenhanced appearance. Secrete contrast material present within the gallbladder lumen. The gallbladder is otherwise unremarkable. No biliary ductal dilatation. The spleen, adrenal glands, and pancreas are grossly normal.  Secretory contrast material present  within the renal collecting systems bilaterally. Kidneys are unremarkable without evidence of nephrolithiasis or hydronephrosis.  Scattered foci of free intraperitoneal air now seen within the upper abdomen, anterior to the left hepatic lobe (series 2, image 23), as well as within the left upper quadrant. Scattered foci of gas seen within the porta hepatis and along the fissure for ligamentum teres. Few foci of gas seen tracking within the wall of the greater curvature of the stomach (series 2, image 16). No definite portal venous gas. Previously described thickened loops of small bowel not as well appreciated on this noncontrast examination, but are grossly similar. Free fluid within the lower pelvis has increased relative to prior.  Excrete contrast material present within the bladder lumen. The bladder is otherwise unremarkable. Prostate is within normal limits.  Bullet fragment again noted anterior to the left femur. No acute osseous abnormality.  IMPRESSION: 1. Interval development of free intraperitoneal air within the upper abdomen, compatible with perforated viscus. The exact source of of the free air is not definitely identified, however, possible perforated ulcer (gastric or duodenal) could be considered given the distribution of gas within the upper or peritoneal cavity. 2. Interval increase in moderate volume ascites. 3. Small bilateral pleural effusions with associated atelectasis.  Critical Value/emergent results were called by telephone at the time of interpretation on 09/29/2013 at 5:51 AM to Dr. Dione BoozeAVID GLICK , who verbally acknowledged these results.   Electronically Signed   By: Rise MuBenjamin  McClintock M.D.   On: 09/29/2013 06:13   Ct Abdomen Pelvis W Contrast  09/28/2013   CLINICAL DATA:  Left flank pain.  Epigastric pain.  EXAM: CT ABDOMEN AND PELVIS WITH CONTRAST  TECHNIQUE: Multidetector CT imaging of the abdomen and pelvis was performed using the standard protocol following bolus administration  of intravenous contrast.  CONTRAST:  50 mL OMNIPAQUE IOHEXOL 300 MG/ML SOLN, 100 mL OMNIPAQUE IOHEXOL 300 MG/ML SOLN  COMPARISON:  CT abdomen and pelvis 02/06/2013.  FINDINGS: The lung bases are clear.  No pleural or pericardial effusion.  The gallbladder, liver, spleen, adrenal glands, pancreas and kidneys appear normal. There is a small volume of free pelvic fluid. The appendix and colon appear normal. The stomach is unremarkable. Multiple fluid-filled loops of small bowel are identified and demonstrate mild wall thickening without dilatation. There is no pneumatosis, portal venous gas, focal fluid collection or free intraperitoneal air. No lymphadenopathy is identified.  Bullet fragment projecting anterior to the right femur is identified. Irregularity of the posterior right acetabulum and inferior aspect of the right femoral head is likely posttraumatic. The appearance is unchanged.  IMPRESSION: Small volume of free pelvic fluid with mild bowel wall thickening and multiple fluid-filled loops most consistent with infectious or inflammatory enteritis. The terminal ileum appears normal.   Electronically Signed   By: Drusilla Kannerhomas  Dalessio M.D.   On: 09/28/2013 21:15    Anti-infectives: Anti-infectives   Start     Dose/Rate Route Frequency Ordered Stop   09/29/13 1400  piperacillin-tazobactam (ZOSYN) IVPB 3.375 g     3.375 g 12.5 mL/hr over 240 Minutes Intravenous Every 8 hours 09/29/13 1040     09/29/13 0600  piperacillin-tazobactam (ZOSYN) IVPB 3.375 g     3.375 g 100 mL/hr over 30 Minutes Intravenous  Once 09/29/13 0559 09/29/13 0711      Assessment/Plan: s/p Procedure(s): EXPLORATORY LAPAROTOMY GASTRORRHAPHY Impression: Stable on postoperative day one, status post gastrorrhaphy. Will give IV fluid do to small rise in creatinine level. Leukocytosis not uncommon given his peritonitis. Continue NG tube decompression.  LOS: 2 days    Dalia HeadingMark A Morse Brueggemann 09/30/2013

## 2013-09-30 NOTE — Care Management Note (Addendum)
    Page 1 of 1   10/02/2013     2:10:23 PM CARE MANAGEMENT NOTE 10/02/2013  Patient:  JeffreyJeffrey Flores   Account Number:  [id number]  Date Initiated:  09/30/2013  Documentation initiated by:  Rosemary HolmsOBSON,Christianna Belmonte  Subjective/Objective Assessment:   Pt admitted from home where he lives with friend. Not very forth coming in information. Does not have a PCP and agrees to the [facility]     Action/Plan:   Anticipated DC Date:     Anticipated DC Plan:  HOME/SELF CARE      DC Planning Services  CM consult      Choice offered to / List presented to:             Status of service:  Completed, signed off Medicare Important Message given?   (If response is "NO", the following Medicare IM given date fields will be blank) Date Medicare IM given:   Date Additional Medicare IM given:    Discharge Disposition:    Per UR Regulation:    If discussed at Long Length of Stay Meetings, dates discussed:    Comments:  09/30/13 Rosemary HolmsAmy Marcheta Horsey RN BSN CM Pt set up with the [facility] 10/04/13 @ 10:00. Explained to pt this will be a financial eligibility appt prior to a medical appt.

## 2013-10-01 LAB — CBC
HCT: 37.2 % — ABNORMAL LOW (ref 39.0–52.0)
Hemoglobin: 12.6 g/dL — ABNORMAL LOW (ref 13.0–17.0)
MCH: 30.7 pg (ref 26.0–34.0)
MCHC: 33.9 g/dL (ref 30.0–36.0)
MCV: 90.5 fL (ref 78.0–100.0)
Platelets: 214 10*3/uL (ref 150–400)
RBC: 4.11 MIL/uL — ABNORMAL LOW (ref 4.22–5.81)
RDW: 13.1 % (ref 11.5–15.5)
WBC: 12.7 10*3/uL — ABNORMAL HIGH (ref 4.0–10.5)

## 2013-10-01 LAB — BASIC METABOLIC PANEL
BUN: 16 mg/dL (ref 6–23)
CO2: 24 mEq/L (ref 19–32)
Calcium: 8.8 mg/dL (ref 8.4–10.5)
Chloride: 104 mEq/L (ref 96–112)
Creatinine, Ser: 1.36 mg/dL — ABNORMAL HIGH (ref 0.50–1.35)
GFR calc Af Amer: 76 mL/min — ABNORMAL LOW (ref >90)
GFR calc non Af Amer: 65 mL/min — ABNORMAL LOW (ref >90)
Glucose, Bld: 82 mg/dL (ref 70–99)
Potassium: 3.6 mEq/L — ABNORMAL LOW (ref 3.7–5.3)
Sodium: 141 mEq/L (ref 137–147)

## 2013-10-01 LAB — H. PYLORI ANTIBODY, IGG: H PYLORI IGG: 6.3 {ISR} — AB

## 2013-10-01 MED ORDER — POTASSIUM PHOSPHATE DIBASIC 3 MMOLE/ML IV SOLN
30.0000 mmol | Freq: Once | INTRAVENOUS | Status: AC
  Administered 2013-10-01: 30 mmol via INTRAVENOUS
  Filled 2013-10-01: qty 10

## 2013-10-01 MED ORDER — KCL IN DEXTROSE-NACL 20-5-0.45 MEQ/L-%-% IV SOLN
INTRAVENOUS | Status: DC
  Administered 2013-10-01: 11:00:00 via INTRAVENOUS

## 2013-10-01 MED ORDER — MAGNESIUM SULFATE 40 MG/ML IJ SOLN
2.0000 g | Freq: Once | INTRAMUSCULAR | Status: AC
  Administered 2013-10-01: 2 g via INTRAVENOUS
  Filled 2013-10-01: qty 50

## 2013-10-01 NOTE — Progress Notes (Signed)
MEDICATION RELATED CONSULT NOTE - INITIAL   Pharmacy Consult for Phosphorus Replacement Indication: hypophosphotemia  Allergies  Allergen Reactions  . Contrast Media [Iodinated Diagnostic Agents] Hives and Itching    Patient Measurements: Height: 5\' 11"  (180.3 cm) Weight: 172 lb (78.019 kg) IBW/kg (Calculated) : 75.3  Vital Signs: Temp: 100.7 F (38.2 C) (04/28 0602) Temp src: Oral (04/28 0602) BP: 143/84 mmHg (04/28 0602) Pulse Rate: 94 (04/28 0602) Intake/Output from previous day: 04/27 0701 - 04/28 0700 In: 590 [P.O.:540; IV Piggyback:50] Out: 600 [Urine:600] Intake/Output from this shift:    Labs:  Recent Labs  09/28/13 1534 09/29/13 0029 09/30/13 0540 10/01/13 0521  WBC 7.2 5.7 15.6* 12.7*  HGB 14.5 14.4 12.9* 12.6*  HCT 41.8 42.3 37.8* 37.2*  PLT 300 266 234 214  CREATININE 1.10 1.00 1.42* 1.36*  MG  --   --  1.4*  --   PHOS  --   --  1.9*  --   ALBUMIN 3.7 3.5  --   --   PROT 7.5 7.2  --   --   AST 17 17  --   --   ALT 10 10  --   --   ALKPHOS 92 88  --   --   BILITOT 0.7 0.7  --   --    Estimated Creatinine Clearance: 79.2 ml/min (by C-G formula based on Cr of 1.36).   Microbiology: No results found for this or any previous visit (from the past 720 hour(s)).  Medical History: Past Medical History  Diagnosis Date  . GERD (gastroesophageal reflux disease)     Medications:  Scheduled:  . enoxaparin (LOVENOX) injection  40 mg Subcutaneous Q24H  . magnesium sulfate 1 - 4 g bolus IVPB  2 g Intravenous Once  . nicotine  21 mg Transdermal Daily  . pantoprazole (PROTONIX) IV  40 mg Intravenous Q24H  . piperacillin-tazobactam (ZOSYN)  IV  3.375 g Intravenous Q8H    Assessment: 38 yo M s/p exploratory laparotomy, gastrorrhaphy for peptic ulcer disease. He is POD#2.  He is currently NPO. Phosphorus, Potassium, & Magnesium levels are low.   Renal function is trending back to patient's baseline.   Goal of Therapy:  Potassium 3.7-5.3 Phosphorus  2.3-4.6 Magnesium 1.5-2.5  Plan:  Potassium Phosphate 30 mMol IV x1 Magnesium 2gm IV per MD Recheck electrolytes in am  Mercy RidingAndrea Michelle Trinitey Roache 10/01/2013,8:39 AM

## 2013-10-01 NOTE — Progress Notes (Signed)
2 Days Post-Op  Subjective: Is having fevers, blood cultures pending. Complains mostly of NG tube.  Objective: Vital signs in last 24 hours: Temp:  [99.3 F (37.4 C)-103.1 F (39.5 C)] 100.7 F (38.2 C) (04/28 0602) Pulse Rate:  [94-115] 94 (04/28 0602) Resp:  [20] 20 (04/28 0602) BP: (143-157)/(83-84) 143/84 mmHg (04/28 0602) SpO2:  [91 %-93 %] 91 % (04/28 0602) Last BM Date: 09/28/13  Intake/Output from previous day: 04/27 0701 - 04/28 0700 In: 590 [P.O.:540; IV Piggyback:50] Out: 600 [Urine:600] Intake/Output this shift:    General appearance: alert, cooperative and no distress Resp: clear to auscultation bilaterally Cardio: regular rate and rhythm, S1, S2 normal, no murmur, click, rub or gallop GI: Soft. JP drainage was serosanguineous drainage. Dressing dry and intact.  Lab Results:   Recent Labs  09/30/13 0540 10/01/13 0521  WBC 15.6* 12.7*  HGB 12.9* 12.6*  HCT 37.8* 37.2*  PLT 234 214   BMET  Recent Labs  09/30/13 0540 10/01/13 0521  NA 141 141  K 3.7 3.6*  CL 104 104  CO2 25 24  GLUCOSE 112* 82  BUN 13 16  CREATININE 1.42* 1.36*  CALCIUM 8.5 8.8   PT/INR No results found for this basename: LABPROT, INR,  in the last 72 hours  Studies/Results: No results found.  Anti-infectives: Anti-infectives   Start     Dose/Rate Route Frequency Ordered Stop   09/29/13 1400  piperacillin-tazobactam (ZOSYN) IVPB 3.375 g     3.375 g 12.5 mL/hr over 240 Minutes Intravenous Every 8 hours 09/29/13 1040     09/29/13 0600  piperacillin-tazobactam (ZOSYN) IVPB 3.375 g     3.375 g 100 mL/hr over 30 Minutes Intravenous  Once 09/29/13 0559 09/29/13 0711      Assessment/Plan: s/p Procedure(s): EXPLORATORY LAPAROTOMY GASTRORRHAPHY Impression: Stable on postoperative day 2, status post gastrorrhaphy for perforated peptic ulcer disease. Fevers may be secondary to peritonitis from his perforation. He is on antibiotics. Leukocytosis resolving. Will supplement  hypophosphatemia and hypomagnesemia. Plan: Hopefully can remove NG tube in next 24-48 hours.  LOS: 3 days    Dalia HeadingMark A Dolan Xia 10/01/2013

## 2013-10-02 ENCOUNTER — Encounter (HOSPITAL_COMMUNITY): Payer: Self-pay | Admitting: General Surgery

## 2013-10-02 LAB — CBC
HEMATOCRIT: 34.2 % — AB (ref 39.0–52.0)
HEMOGLOBIN: 11.8 g/dL — AB (ref 13.0–17.0)
MCH: 31.1 pg (ref 26.0–34.0)
MCHC: 34.5 g/dL (ref 30.0–36.0)
MCV: 90 fL (ref 78.0–100.0)
Platelets: 209 10*3/uL (ref 150–400)
RBC: 3.8 MIL/uL — ABNORMAL LOW (ref 4.22–5.81)
RDW: 13.2 % (ref 11.5–15.5)
WBC: 8.4 10*3/uL (ref 4.0–10.5)

## 2013-10-02 LAB — BASIC METABOLIC PANEL
BUN: 15 mg/dL (ref 6–23)
CO2: 29 mEq/L (ref 19–32)
Calcium: 8.4 mg/dL (ref 8.4–10.5)
Chloride: 106 mEq/L (ref 96–112)
Creatinine, Ser: 1.32 mg/dL (ref 0.50–1.35)
GFR calc Af Amer: 78 mL/min — ABNORMAL LOW (ref >90)
GFR, EST NON AFRICAN AMERICAN: 68 mL/min — AB (ref >90)
GLUCOSE: 94 mg/dL (ref 70–99)
Potassium: 3.6 mEq/L — ABNORMAL LOW (ref 3.7–5.3)
Sodium: 145 mEq/L (ref 137–147)

## 2013-10-02 LAB — PHOSPHORUS: Phosphorus: 4 mg/dL (ref 2.3–4.6)

## 2013-10-02 LAB — MAGNESIUM: Magnesium: 2 mg/dL (ref 1.5–2.5)

## 2013-10-02 MED ORDER — OXYCODONE-ACETAMINOPHEN 5-325 MG PO TABS
1.0000 | ORAL_TABLET | ORAL | Status: DC | PRN
  Filled 2013-10-02: qty 2
  Filled 2013-10-02: qty 1

## 2013-10-02 NOTE — Progress Notes (Signed)
3 Days Post-Op  Subjective: NG tube came out accidentally this morning. Patient denies abdominal pain.  Objective: Vital signs in last 24 hours: Temp:  [98 F (36.7 C)-101.1 F (38.4 C)] 98.7 F (37.1 C) (04/29 0448) Pulse Rate:  [72-83] 81 (04/29 0448) Resp:  [16-20] 20 (04/29 0448) BP: (128-147)/(66-84) 141/84 mmHg (04/29 0448) SpO2:  [96 %-99 %] 99 % (04/29 0448) Last BM Date: 10/01/13  Intake/Output from previous day: 04/28 0701 - 04/29 0700 In: -  Out: 1595 [Emesis/NG output:900; Drains:695] Intake/Output this shift:    General appearance: alert, cooperative and no distress Resp: clear to auscultation bilaterally Cardio: regular rate and rhythm, S1, S2 normal, no murmur, click, rub or gallop GI: Soft. Incision healing well. Serosanguineous drainage from JP drain.  Lab Results:   Recent Labs  10/01/13 0521 10/02/13 0552  WBC 12.7* 8.4  HGB 12.6* 11.8*  HCT 37.2* 34.2*  PLT 214 209   BMET  Recent Labs  10/01/13 0521 10/02/13 0552  NA 141 145  K 3.6* 3.6*  CL 104 106  CO2 24 29  GLUCOSE 82 94  BUN 16 15  CREATININE 1.36* 1.32  CALCIUM 8.8 8.4   PT/INR No results found for this basename: LABPROT, INR,  in the last 72 hours  Studies/Results: No results found.  Anti-infectives: Anti-infectives   Start     Dose/Rate Route Frequency Ordered Stop   09/29/13 1400  piperacillin-tazobactam (ZOSYN) IVPB 3.375 g     3.375 g 12.5 mL/hr over 240 Minutes Intravenous Every 8 hours 09/29/13 1040     09/29/13 0600  piperacillin-tazobactam (ZOSYN) IVPB 3.375 g     3.375 g 100 mL/hr over 30 Minutes Intravenous  Once 09/29/13 0559 09/29/13 0711      Assessment/Plan: s/p Procedure(s): EXPLORATORY LAPAROTOMY GASTRORRHAPHY Impression: Stable postoperatively. Will leave NG tube out. Will advance diet as tolerated. Patient is H. Pylori  positive. Will treat this as an outpatient. Hypomagnesemia and hypophosphatemia have resolved. Anticipate discharge in next 24-48  hours.  LOS: 4 days    Jeffrey HeadingMark A Flores [name] 10/02/2013

## 2013-10-02 NOTE — Progress Notes (Signed)
MEDICATION RELATED CONSULT NOTE - follow up  Pharmacy Consult for Phosphorus Replacement Indication: hypophosphotemia  Allergies  Allergen Reactions  . Contrast Media [Iodinated Diagnostic Agents] Hives and Itching   Patient Measurements: Height: 5\' 11"  (180.3 cm) Weight: 172 lb (78.019 kg) IBW/kg (Calculated) : 75.3  Vital Signs: Temp: 98.7 F (37.1 C) (04/29 0448) Temp src: Oral (04/29 0448) BP: 141/84 mmHg (04/29 0448) Pulse Rate: 81 (04/29 0448) Intake/Output from previous day: 04/28 0701 - 04/29 0700 In: -  Out: 1595 [Emesis/NG output:900; Drains:695] Intake/Output from this shift:    Labs:  Recent Labs  09/30/13 0540 10/01/13 0521 10/02/13 0552  WBC 15.6* 12.7* 8.4  HGB 12.9* 12.6* 11.8*  HCT 37.8* 37.2* 34.2*  PLT 234 214 209  CREATININE 1.42* 1.36* 1.32  MG 1.4*  --  2.0  PHOS 1.9*  --  4.0   Estimated Creatinine Clearance: 81.6 ml/min (by C-G formula based on Cr of 1.32).  Microbiology: Recent Results (from the past 720 hour(s))  CULTURE, BLOOD (ROUTINE X 2)     Status: None   Collection Time    09/30/13  5:30 PM      Result Value Ref Range Status   Specimen Description BLOOD LEFT ANTECUBITAL   Final   Special Requests BOTTLES DRAWN AEROBIC AND ANAEROBIC 6CC   Final   Culture NO GROWTH 1 DAY   Final   Report Status PENDING   Incomplete  CULTURE, BLOOD (ROUTINE X 2)     Status: None   Collection Time    09/30/13  5:36 PM      Result Value Ref Range Status   Specimen Description BLOOD LEFT HAND   Final   Special Requests BOTTLES DRAWN AEROBIC AND ANAEROBIC 6CC   Final   Culture NO GROWTH 1 DAY   Final   Report Status PENDING   Incomplete    Medical History: Past Medical History  Diagnosis Date  . GERD (gastroesophageal reflux disease)    Medications:  Scheduled:  . enoxaparin (LOVENOX) injection  40 mg Subcutaneous Q24H  . nicotine  21 mg Transdermal Daily  . pantoprazole (PROTONIX) IV  40 mg Intravenous Q24H  . piperacillin-tazobactam  (ZOSYN)  IV  3.375 g Intravenous Q8H   Assessment: 38 yo M s/p exploratory laparotomy, gastrorrhaphy for peptic ulcer disease. He is currently NPO. Phosphorus, Potassium, & Magnesium levels were low yesterday and replenished.  Phos and Mg are WNL today, K+ is slightly low. Renal function is trending back to patient's baseline.   Goal of Therapy:  Potassium 3.7-5.3 Phosphorus 2.3-4.6 Magnesium 1.5-2.5  Plan:  No additional K-Phos or Magnesium today. Consider KCL replacement for hypokalemia (either PO or IV) Recheck electrolytes in am  KeySpanScott A Aleks Nawrot 10/02/2013,7:55 AM

## 2013-10-02 NOTE — Progress Notes (Signed)
Upon going in patient room at 0615 to hang a 0600 antibiotic I noted his NG tube lying in the bed next to him.  He states he does not know how it got out and refuses to allow me to place another NG tube.  Dr. Lovell SheehanJenkins notified via phone and new orders given and placed into [facility].  Pt informed of the diet change and instructed to start with small sips of water to allow is stomach to adjust.  Nursing staff to continue to monitor.

## 2013-10-03 LAB — BASIC METABOLIC PANEL
BUN: 8 mg/dL (ref 6–23)
CO2: 26 meq/L (ref 19–32)
Calcium: 8.8 mg/dL (ref 8.4–10.5)
Chloride: 103 mEq/L (ref 96–112)
Creatinine, Ser: 1.18 mg/dL (ref 0.50–1.35)
GFR calc Af Amer: 90 mL/min — ABNORMAL LOW (ref >90)
GFR calc non Af Amer: 77 mL/min — ABNORMAL LOW (ref >90)
GLUCOSE: 91 mg/dL (ref 70–99)
Potassium: 3.5 mEq/L — ABNORMAL LOW (ref 3.7–5.3)
SODIUM: 142 meq/L (ref 137–147)

## 2013-10-03 LAB — PHOSPHORUS: Phosphorus: 3.9 mg/dL (ref 2.3–4.6)

## 2013-10-03 LAB — MAGNESIUM: MAGNESIUM: 1.8 mg/dL (ref 1.5–2.5)

## 2013-10-03 MED ORDER — OXYCODONE-ACETAMINOPHEN 7.5-325 MG PO TABS: 1.0000 | ORAL_TABLET | ORAL | Status: AC | PRN

## 2013-10-03 MED ORDER — AMPICILLIN 500 MG PO CAPS: 500.0000 mg | ORAL_CAPSULE | Freq: Four times a day (QID) | ORAL | Status: AC

## 2013-10-03 MED ORDER — OMEPRAZOLE 20 MG PO CPDR: 20.0000 mg | DELAYED_RELEASE_CAPSULE | Freq: Every day | ORAL | Status: AC

## 2013-10-03 MED ORDER — PANTOPRAZOLE SODIUM 40 MG PO TBEC
40.0000 mg | DELAYED_RELEASE_TABLET | Freq: Every day | ORAL | Status: DC
  Administered 2013-10-03: 40 mg via ORAL
  Filled 2013-10-03: qty 1

## 2013-10-03 MED ORDER — CLARITHROMYCIN 500 MG PO TABS: 500.0000 mg | ORAL_TABLET | Freq: Two times a day (BID) | ORAL | Status: AC

## 2013-10-03 NOTE — Progress Notes (Signed)
MEDICATION RELATED CONSULT NOTE - follow up  Pharmacy Consult for Phosphorus Replacement Indication: hypophosphotemia  Allergies  Allergen Reactions  . Contrast Media [Iodinated Diagnostic Agents] Hives and Itching   Patient Measurements: Height: 5\' 11"  (180.3 cm) Weight: 172 lb (78.019 kg) IBW/kg (Calculated) : 75.3  Vital Signs: Temp: 99.1 F (37.3 C) (04/30 0658) Temp src: Oral (04/30 0658) BP: 132/80 mmHg (04/30 0658) Pulse Rate: 78 (04/30 0658) Intake/Output from previous day: 04/29 0701 - 04/30 0700 In: 1250 [P.O.:1200; IV Piggyback:50] Out: 615 [Urine:600; Drains:15] Intake/Output from this shift:    Labs:  Recent Labs  10/01/13 0521 10/02/13 0552 10/03/13 0544  WBC 12.7* 8.4  --   HGB 12.6* 11.8*  --   HCT 37.2* 34.2*  --   PLT 214 209  --   CREATININE 1.36* 1.32 1.18  MG  --  2.0 1.8  PHOS  --  4.0 3.9   Estimated Creatinine Clearance: 91.3 ml/min (by C-G formula based on Cr of 1.18).  Microbiology: Recent Results (from the past 720 hour(s))  CULTURE, BLOOD (ROUTINE X 2)     Status: None   Collection Time    09/30/13  5:30 PM      Result Value Ref Range Status   Specimen Description BLOOD LEFT ANTECUBITAL   Final   Special Requests BOTTLES DRAWN AEROBIC AND ANAEROBIC 6CC   Final   Culture NO GROWTH 2 DAYS   Final   Report Status PENDING   Incomplete  CULTURE, BLOOD (ROUTINE X 2)     Status: None   Collection Time    09/30/13  5:36 PM      Result Value Ref Range Status   Specimen Description BLOOD LEFT HAND   Final   Special Requests BOTTLES DRAWN AEROBIC AND ANAEROBIC 6CC   Final   Culture NO GROWTH 2 DAYS   Final   Report Status PENDING   Incomplete   Medical History: Past Medical History  Diagnosis Date  . GERD (gastroesophageal reflux disease)    Medications:  Scheduled:  . enoxaparin (LOVENOX) injection  40 mg Subcutaneous Q24H  . nicotine  21 mg Transdermal Daily  . pantoprazole (PROTONIX) IV  40 mg Intravenous Q24H  .  piperacillin-tazobactam (ZOSYN)  IV  3.375 g Intravenous Q8H   Assessment: 38 yo M s/p exploratory laparotomy, gastrorrhaphy for peptic ulcer disease. He is currently NPO. Phosphorus and Magnesium levels WNL today, K+ is slightly low. Renal function is back to patient's baseline.   Goal of Therapy:  Potassium 3.7-5.3 Phosphorus 2.3-4.6 Magnesium 1.5-2.5  Plan:  No additional K-Phos or Magnesium today. Consider KCL replacement for hypokalemia (either PO or IV) Recheck electrolytes   [name] [name] 10/03/2013,7:53 AM

## 2013-10-03 NOTE — Discharge Instructions (Signed)
Peptic Ulcer °A peptic ulcer is a painful sore in the lining of in your esophagus, stomach, or in the first part of your small intestine. The main causes of an ulcer can be: °· An infection. °· Using certain pain medicines too often or too much. °· Smoking. °HOME CARE °· Avoid smoking, alcohol, and caffeine. °· Avoid foods that bother you. °· Only take medicine as told by your doctor. Do not take any medicines your doctor has not approved. °· Keep all doctor visits as told. °GET HELP RIGHT AWAY IF: °· You do not get better in 7 days after starting treatment. °· You keep having an upset stomach (indigestion) or heartburn. °· You have sudden, sharp, or lasting belly (abdominal) pain. °· You have bloody, black, or tarry poop (stool). °· You throw up (vomit) blood or your throw up looks like coffee grounds. °· You get light headed, weak, or feel like you will pass out (faint). °· You get sweaty or feel sticky and cold to the touch (clammy). °MAKE SURE YOU:  °· Understand these instructions. °· Will watch your condition. °· Will get help right away if you are not doing well or get worse. °Document Released: 08/17/2009 Document Revised: 02/15/2012 Document Reviewed: 12/21/2011 °[employer]® Patient Information ©2014 [employer]. ° °

## 2013-10-03 NOTE — Progress Notes (Signed)
Pt discharged home today per Dr. Lovell SheehanJenkins. Pt's IV site removed before change of shift and site WDL. VSS. Pt provided with home medication list, discharge instructions and prescriptions. Verbalized understanding. Pt left floor via WC in stable condition accompanied by NT.

## 2013-10-03 NOTE — Progress Notes (Signed)
Patient refused IV Zosyn this AM stating "take this IV loose I don't want it on any more and what time does the front doors open so I can go smoke".  It has been explained to the patient the importance of his IV antibiotics and he still refuses.  It has also been explained to the patient that this is a nonsmoking facility and that he is not allowed to smoke on hospital grounds, and if he leaves the hospital grounds he has to sign out AMA and go back through the emergency department to be readmitted.  Patient has disregarded all of this. Is currently still in his room.  Will continue to monitor.

## 2013-10-03 NOTE — Discharge Summary (Signed)
Physician Discharge Summary  Patient ID: [name] MRN: [id number] DOB/AGE: 37/09/1975 37 y.o.  Admit date: 09/28/2013 Discharge date: 10/03/2013  Admission Diagnoses: Perforated viscus  Discharge Diagnoses: Perforated peptic ulcer with peritonitis Active Problems:   Peptic ulcer with perforation   Discharged Condition: good  Hospital Course: Patient is a 38 year old black male who presented emergency room with worsening upper abdominal pain. CT scan the abdomen revealed pneumoperitoneum. Was taken urgently to the operating room and underwent exploratory laparotomy, gastrorrhaphy. Tolerated the procedure well. Postoperative course was for the most part unremarkable. His diet was advanced at difficulty on postoperative day 3. He did test positive for Helicobacter pylori infection. His JP drain was removed on 10/03/2013. Is being discharged home in good improving condition.  Treatments: surgery: Exploratory laparotomy, gastrorrhaphy on 09/28/2013  Discharge Exam: Blood pressure 132/80, pulse 78, temperature 99.1 F (37.3 C), temperature source Oral, resp. rate 20, height 5\' 11"  (1.803 m), weight 78.019 kg (172 lb), SpO2 99.00%. General appearance: alert, cooperative and no distress Resp: clear to auscultation bilaterally Cardio: regular rate and rhythm, S1, S2 normal, no murmur, click, rub or gallop GI: Soft. Flat. Incision healing well.  Disposition: 01-Home or Self Care     Medication List    STOP taking these medications       ranitidine 75 MG tablet  Commonly known as:  ZANTAC      TAKE these medications       ampicillin 500 MG capsule  Commonly known as:  PRINCIPEN  Take 1 capsule (500 mg total) by mouth 4 (four) times daily.     clarithromycin 500 MG tablet  Commonly known as:  BIAXIN  Take 1 tablet (500 mg total) by mouth 2 (two) times daily.     omeprazole 20 MG capsule  Commonly known as:  PRILOSEC  Take 1 capsule (20 mg total) by mouth daily.     oxyCODONE-acetaminophen 7.5-325 MG per tablet  Commonly known as:  PERCOCET  Take 1-2 tablets by mouth every 4 (four) hours as needed.           Follow-up Information   Follow up with [facility]. Call on 10/04/2013. (@ 10:00, bring financial eligibility documents.)    Contact information:   [facility] [address] [place] [phone]      Follow up with Dalia HeadingJENKINS,Simone Rodenbeck A, MD. Schedule an appointment as soon as possible for a visit on 10/08/2013.   Specialty:  General Surgery   Contact information:   [address] [place] [address] [phone]       Signed: Dalia HeadingMark A Kennah Hehr 10/03/2013, 9:15 AM

## 2013-10-03 NOTE — Progress Notes (Signed)
The patient is receiving Protonix by the intravenous route.  Based on criteria approved by the Pharmacy and Therapeutics Committee and the Medical Executive Committee, the medication is being converted to the equivalent oral dose form.  Spoke with the nurse.  Pt lost IV access.  Pt is reportedly eating OK.  These criteria include: -No Active GI bleeding -Able to tolerate diet of full liquids (or better) or tube feeding OR able to tolerate other medications by the oral or enteral route  If you have any questions about this conversion, please contact the Pharmacy Department (ext [phone]).  Thank you.  Jeffrey DenisScott A Mekhia Flores, [employer] 10/03/2013 7:59 AM

## 2013-10-05 LAB — CULTURE, BLOOD (ROUTINE X 2)
CULTURE: NO GROWTH
Culture: NO GROWTH

## 2013-10-22 NOTE — Progress Notes (Signed)
UR chart review completed.  

## 2015-02-05 ENCOUNTER — Encounter (HOSPITAL_COMMUNITY): Payer: Self-pay | Admitting: Emergency Medicine

## 2015-02-05 ENCOUNTER — Emergency Department (HOSPITAL_COMMUNITY): Payer: Self-pay

## 2015-02-05 ENCOUNTER — Emergency Department (HOSPITAL_COMMUNITY)
Admission: EM | Admit: 2015-02-05 | Discharge: 2015-02-05 | Disposition: A | Discharge status: Home / Self Care | Payer: Self-pay | Attending: Emergency Medicine | Admitting: Emergency Medicine

## 2015-02-05 DIAGNOSIS — Z792 Long term (current) use of antibiotics: Secondary | ICD-10-CM | POA: Insufficient documentation

## 2015-02-05 DIAGNOSIS — Z720 Tobacco use: Secondary | ICD-10-CM | POA: Insufficient documentation

## 2015-02-05 DIAGNOSIS — G8929 Other chronic pain: Secondary | ICD-10-CM | POA: Insufficient documentation

## 2015-02-05 DIAGNOSIS — Z79899 Other long term (current) drug therapy: Secondary | ICD-10-CM | POA: Insufficient documentation

## 2015-02-05 DIAGNOSIS — K219 Gastro-esophageal reflux disease without esophagitis: Secondary | ICD-10-CM | POA: Insufficient documentation

## 2015-02-05 DIAGNOSIS — M25461 Effusion, right knee: Secondary | ICD-10-CM | POA: Insufficient documentation

## 2015-02-05 HISTORY — DX: Other chronic pain: G89.29

## 2015-02-05 HISTORY — DX: Pain in unspecified knee: M25.569

## 2015-02-05 MED ORDER — PREDNISONE 50 MG PO TABS
60.0000 mg | ORAL_TABLET | Freq: Once | ORAL | Status: AC
  Administered 2015-02-05: 60 mg via ORAL
  Filled 2015-02-05 (×2): qty 1

## 2015-02-05 MED ORDER — KETOROLAC TROMETHAMINE 10 MG PO TABS
10.0000 mg | ORAL_TABLET | Freq: Once | ORAL | Status: AC
  Administered 2015-02-05: 10 mg via ORAL
  Filled 2015-02-05: qty 1

## 2015-02-05 MED ORDER — DICLOFENAC SODIUM 75 MG PO TBEC: 75.0000 mg | DELAYED_RELEASE_TABLET | Freq: Two times a day (BID) | ORAL | Status: AC

## 2015-02-05 MED ORDER — DEXAMETHASONE 4 MG PO TABS: 4.0000 mg | ORAL_TABLET | Freq: Two times a day (BID) | ORAL | Status: AC

## 2015-02-05 NOTE — Discharge Instructions (Signed)
Your x-ray reveals fluid on the joint, or a knee effusion. You have pain with flexion and extension of the knee. Please see Dr. Romeo Apple, or the orthopedic specialist of your choice as sone as possible. Please use medications as prescribed, please take these with food. Knee Effusion The medical term for having fluid in your knee is effusion. This is often due to an internal derangement of the knee. This means something is wrong inside the knee. Some of the causes of fluid in the knee may be torn cartilage, a torn ligament, or bleeding into the joint from an injury. Your knee is likely more difficult to bend and move. This is often because there is increased pain and pressure in the joint. The time it takes for recovery from a knee effusion depends on different factors, including:   Type of injury.  Your age.  Physical and medical conditions.  Rehabilitation Strategies. How long you will be away from your normal activities will depend on what kind of knee problem you have and how much damage is present. Your knee has two types of cartilage. Articular cartilage covers the bone ends and lets your knee bend and move smoothly. Two menisci, thick pads of cartilage that form a rim inside the joint, help absorb shock and stabilize your knee. Ligaments bind the bones together and support your knee joint. Muscles move the joint, help support your knee, and take stress off the joint itself. CAUSES  Often an effusion in the knee is caused by an injury to one of the menisci. This is often a tear in the cartilage. Recovery after a meniscus injury depends on how much meniscus is damaged and whether you have damaged other knee tissue. Small tears may heal on their own with conservative treatment. Conservative means rest, limited weight bearing activity and muscle strengthening exercises. Your recovery may take up to 6 weeks.  TREATMENT  Larger tears may require surgery. Meniscus injuries may be treated during  arthroscopy. Arthroscopy is a procedure in which your surgeon uses a small telescope like instrument to look in your knee. Your caregiver can make a more accurate diagnosis (learning what is wrong) by performing an arthroscopic procedure. If your injury is on the inner margin of the meniscus, your surgeon may trim the meniscus back to a smooth rim. In other cases your surgeon will try to repair a damaged meniscus with stitches (sutures). This may make rehabilitation take longer, but may provide better long term result by helping your knee keep its shock absorption capabilities. Ligaments which are completely torn usually require surgery for repair. HOME CARE INSTRUCTIONS  Use crutches as instructed.  If a brace is applied, use as directed.  Once you are home, an ice pack applied to your swollen knee may help with discomfort and help decrease swelling.  Keep your knee raised (elevated) when you are not up and around or on crutches.  Only take over-the-counter or prescription medicines for pain, discomfort, or fever as directed by your caregiver.  Your caregivers will help with instructions for rehabilitation of your knee. This often includes strengthening exercises.  You may resume a normal diet and activities as directed. SEEK MEDICAL CARE IF:   There is increased swelling in your knee.  You notice redness, swelling, or increasing pain in your knee.  An unexplained oral temperature above 102 F (38.9 C) develops. SEEK IMMEDIATE MEDICAL CARE IF:   You develop a rash.  You have difficulty breathing.  You have any allergic reactions  from medications you may have been given.  There is severe pain with any motion of the knee. MAKE SURE YOU:   Understand these instructions.  Will watch your condition.  Will get help right away if you are not doing well or get worse. Document Released: 08/13/2003 Document Revised: 08/15/2011 Document Reviewed: 10/17/2007 [facility] Patient  Information 2015 [place], [place]. This information is not intended to replace advice given to you by your health care provider. Make sure you discuss any questions you have with your health care provider.
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
## 2015-02-05 NOTE — ED Provider Notes (Signed)
CSN: [id number]     Arrival date & time 02/05/15  1457 History   First MD Initiated Contact with Patient 02/05/15 1534     Chief Complaint  Patient presents with  . Knee Pain     (Consider location/radiation/quality/duration/timing/severity/associated sxs/prior Treatment) Patient is a 39 y.o. male presenting with knee pain. The history is provided by the patient.  Knee Pain Time since incident:  3 weeks Pain details:    Quality:  Tearing and aching   Severity:  Moderate   Onset quality:  Gradual   Duration:  3 weeks   Timing:  Intermittent   Progression:  Worsening Dislocation: no   Relieved by:  Nothing Worsened by:  Bearing weight and activity Ineffective treatments:  Rest Associated symptoms: decreased ROM   Associated symptoms: no numbness and no tingling   Risk factors: no frequent fractures, no known bone disorder and no recent illness     Past Medical History  Diagnosis Date  . GERD (gastroesophageal reflux disease)   . Chronic knee pain    Past Surgical History  Procedure Laterality Date  . None    . Esophagogastroduodenoscopy (egd) with propofol N/A 03/19/2013    Procedure: ESOPHAGOGASTRODUODENOSCOPY (EGD) WITH PROPOFOL;  Surgeon: West Bali, MD;  Location: [facility];  Service: Endoscopy;  Laterality: N/A;  . Esophageal biopsy N/A 03/19/2013    Procedure: Gastric Biopsies;  Surgeon: West Bali, MD;  Location: [facility];  Service: Endoscopy;  Laterality: N/A;  . Laparotomy N/A 09/29/2013    Procedure: EXPLORATORY LAPAROTOMY;  Surgeon: Dalia Heading, MD;  Location: [facility];  Service: General;  Laterality: N/A;  . Gastrorrhaphy N/A 09/29/2013    Procedure: Ferrel Logan;  Surgeon: Dalia Heading, MD;  Location: [facility];  Service: General;  Laterality: N/A;   Family History  Problem Relation Age of Onset  . Colon cancer Neg Hx   . Kidney failure Father    Social History  Substance Use Topics  . Smoking status: Current Every Day Smoker -- 0.50 packs/day for 15  years    Types: Cigarettes  . Smokeless tobacco: Never Used  . Alcohol Use: Yes     Comment: beer twice a week.     Review of Systems  Musculoskeletal: Positive for arthralgias.  All other systems reviewed and are negative.     Allergies  Contrast media  Home Medications   Prior to Admission medications   Medication Sig Start Date End Date Taking? Authorizing Provider  ampicillin (PRINCIPEN) 500 MG capsule Take 1 capsule (500 mg total) by mouth 4 (four) times daily. 10/03/13   Franky Macho, MD  clarithromycin (BIAXIN) 500 MG tablet Take 1 tablet (500 mg total) by mouth 2 (two) times daily. 10/03/13   Franky Macho, MD  omeprazole (PRILOSEC) 20 MG capsule Take 1 capsule (20 mg total) by mouth daily. 10/03/13   Franky Macho, MD  oxyCODONE-acetaminophen (PERCOCET) 7.5-325 MG per tablet Take 1-2 tablets by mouth every 4 (four) hours as needed. 10/03/13   Franky Macho, MD   BP 111/73 mmHg  Pulse 81  Temp(Src) 98.2 F (36.8 C) (Oral)  Resp 20  Ht  (1.803 m)  Wt 193 lb (87.544 kg)  BMI 26.93 kg/m2  SpO2 99% Physical Exam  Constitutional: He is oriented to person, place, and time. He appears well-developed and well-nourished.  Non-toxic appearance.  HENT:  Head: Normocephalic.  Right Ear: Tympanic membrane and external ear normal.  Left Ear: Tympanic membrane and external ear normal.  Eyes:  EOM and lids are normal. Pupils are equal, round, and reactive to light.  Neck: Normal range of motion. Neck supple. Carotid bruit is not present.  Cardiovascular: Normal rate, regular rhythm, normal heart sounds, intact distal pulses and normal pulses.   Pulmonary/Chest: Breath sounds normal. No respiratory distress.  Abdominal: Soft. Bowel sounds are normal. There is no tenderness. There is no guarding.  Musculoskeletal:       Right knee: He exhibits decreased range of motion. He exhibits no swelling, no effusion and no deformity. Tenderness found. Lateral joint line tenderness noted.   Lymphadenopathy:       Head (right side): No submandibular adenopathy present.       Head (left side): No submandibular adenopathy present.    He has no cervical adenopathy.  Neurological: He is alert and oriented to person, place, and time. He has normal strength. No cranial nerve deficit or sensory deficit.  Skin: Skin is warm and dry.  Psychiatric: He has a normal mood and affect. His speech is normal.  Nursing note and vitals reviewed.   ED Course  Procedures (including critical care time) Labs Review Labs Reviewed - No data to display  Imaging Review No results found. I have personally reviewed and evaluated these images and lab results as part of my medical decision-making.   EKG Interpretation None      MDM  Vital signs are well within normal limits. X-ray of the right knee suggest a small effusion present. No gross neurologic deficits appreciated on today's exam. Patient referred to orthopedics. Prescription for diclofenac and Decadron given to the patient. Patient also provided with a knee immobilizer and crutches.    Final diagnoses:  Knee effusion, right    **I have reviewed nursing notes, vital signs, and all appropriate lab and imaging results for this patient.Ivery Quale, PA-C 02/07/15 1610  Vanetta Mulders, MD 02/11/15 [phone]

## 2015-02-16 ENCOUNTER — Ambulatory Visit: Payer: Self-pay | Admitting: Orthopedic Surgery

## 2015-04-16 ENCOUNTER — Emergency Department (HOSPITAL_COMMUNITY): Payer: PRIVATE HEALTH INSURANCE

## 2015-04-16 ENCOUNTER — Encounter (HOSPITAL_COMMUNITY): Payer: Self-pay | Admitting: Emergency Medicine

## 2015-04-16 ENCOUNTER — Emergency Department (HOSPITAL_COMMUNITY)
Admission: EM | Admit: 2015-04-16 | Discharge: 2015-04-16 | Disposition: A | Discharge status: Home / Self Care | Payer: PRIVATE HEALTH INSURANCE | Attending: Emergency Medicine | Admitting: Emergency Medicine

## 2015-04-16 DIAGNOSIS — G8929 Other chronic pain: Secondary | ICD-10-CM | POA: Insufficient documentation

## 2015-04-16 DIAGNOSIS — Y9389 Activity, other specified: Secondary | ICD-10-CM | POA: Insufficient documentation

## 2015-04-16 DIAGNOSIS — Y9289 Other specified places as the place of occurrence of the external cause: Secondary | ICD-10-CM | POA: Insufficient documentation

## 2015-04-16 DIAGNOSIS — S6991XA Unspecified injury of right wrist, hand and finger(s), initial encounter: Secondary | ICD-10-CM | POA: Diagnosis present

## 2015-04-16 DIAGNOSIS — Z720 Tobacco use: Secondary | ICD-10-CM | POA: Diagnosis not present

## 2015-04-16 DIAGNOSIS — K219 Gastro-esophageal reflux disease without esophagitis: Secondary | ICD-10-CM | POA: Insufficient documentation

## 2015-04-16 DIAGNOSIS — Y998 Other external cause status: Secondary | ICD-10-CM | POA: Insufficient documentation

## 2015-04-16 DIAGNOSIS — S63501A Unspecified sprain of right wrist, initial encounter: Secondary | ICD-10-CM | POA: Diagnosis not present

## 2015-04-16 DIAGNOSIS — R52 Pain, unspecified: Secondary | ICD-10-CM

## 2015-04-16 MED ORDER — HYDROCODONE-ACETAMINOPHEN 5-325 MG PO TABS: 1.0000 | ORAL_TABLET | ORAL | Status: AC | PRN

## 2015-04-16 NOTE — Discharge Instructions (Signed)
Wrist Sprain With Rehab A sprain is an injury in which a ligament that maintains the proper alignment of a joint is partially or completely torn. The ligaments of the wrist are susceptible to sprains. Sprains are classified into three categories. Grade 1 sprains cause pain, but the tendon is not lengthened. Grade 2 sprains include a lengthened ligament because the ligament is stretched or partially ruptured. With grade 2 sprains there is still function, although the function may be diminished. Grade 3 sprains are characterized by a complete tear of the tendon or muscle, and function is usually impaired. SYMPTOMS   Pain tenderness, inflammation, and/or bruising (contusion) of the injury.  A "pop" or tear felt and/or heard at the time of injury.  Decreased wrist function. CAUSES  A wrist sprain occurs when a force is placed on one or more ligaments that is greater than it/they can withstand. Common mechanisms of injury include:  Catching a ball with your hands.  Repetitive and/ or strenuous extension or flexion of the wrist. RISK INCREASES WITH:  Previous wrist injury.  Contact sports (boxing or wrestling).  Activities in which falling is common.  Poor strength and flexibility.  Improperly fitted or padded protective equipment. PREVENTION  Warm up and stretch properly before activity.  Allow for adequate recovery between workouts.  Maintain physical fitness:  Strength, flexibility, and endurance.  Cardiovascular fitness.  Protect the wrist joint by limiting its motion with the use of taping, braces, or splints.  Protect the wrist after injury for 6 to 12 months. PROGNOSIS  The prognosis for wrist sprains depends on the degree of injury. Grade 1 sprains require 2 to 6 weeks of treatment. Grade 2 sprains require 6 to 8 weeks of treatment, and grade 3 sprains require up to 12 weeks.  RELATED COMPLICATIONS   Prolonged healing time, if improperly treated or  re-injured.  Recurrent symptoms that result in a chronic problem.  Injury to nearby structures (bone, cartilage, nerves, or tendons).  Arthritis of the wrist.  Inability to compete in athletics at a high level.  Wrist stiffness or weakness.  Progression to a complete rupture of the ligament. TREATMENT  Treatment initially involves resting from any activities that aggravate the symptoms, and the use of ice and medications to help reduce pain and inflammation. Your caregiver may recommend immobilizing the wrist for a period of time in order to reduce stress on the ligament and allow for healing. After immobilization it is important to perform strengthening and stretching exercises to help regain strength and a full range of motion. These exercises may be completed at home or with a therapist. Surgery is not usually required for wrist sprains, unless the ligament has been ruptured (grade 3 sprain). MEDICATION   If pain medication is necessary, then nonsteroidal anti-inflammatory medications, such as aspirin and ibuprofen, or other minor pain relievers, such as acetaminophen, are often recommended.  Do not take pain medication for 7 days before surgery.  Prescription pain relievers may be given if deemed necessary by your caregiver. Use only as directed and only as much as you need. HEAT AND COLD  Cold treatment (icing) relieves pain and reduces inflammation. Cold treatment should be applied for 10 to 15 minutes every 2 to 3 hours for inflammation and pain and immediately after any activity that aggravates your symptoms. Use ice packs or massage the area with a piece of ice (ice massage).  Heat treatment may be used prior to performing the stretching and strengthening activities prescribed by your  caregiver, physical therapist, or athletic trainer. Use a heat pack or soak your injury in warm water. SEEK MEDICAL CARE IF:  Treatment seems to offer no benefit, or the condition worsens.  Any  medications produce adverse side effects.   Wear the wrist splint to help provide .  Use ice and elevation as much as possible for the next several days to help reduce the swelling.  Take the medications prescribed.  You may take the hydrocodone prescribed for pain relief.  This will make you drowsy - do not drive within 4 hours of taking this medication.  Continue using your naproxen for inflammation.  Call the orthopedic doctor listed for a recheck of your injury in 2 weeks if your symptoms are not improved.  You may benefit from physical therapy or further evaluation if not improving.
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
## 2015-05-19 ENCOUNTER — Encounter: Payer: Self-pay | Admitting: *Deleted

## 2015-05-19 ENCOUNTER — Ambulatory Visit: Payer: PRIVATE HEALTH INSURANCE | Admitting: Orthopedic Surgery

## 2015-07-12 ENCOUNTER — Emergency Department (HOSPITAL_COMMUNITY)
Admission: EM | Admit: 2015-07-12 | Discharge: 2015-07-12 | Disposition: A | Discharge status: Home / Self Care | Payer: PRIVATE HEALTH INSURANCE | Attending: Emergency Medicine | Admitting: Emergency Medicine

## 2015-07-12 ENCOUNTER — Encounter (HOSPITAL_COMMUNITY): Payer: Self-pay | Admitting: *Deleted

## 2015-07-12 ENCOUNTER — Emergency Department (HOSPITAL_COMMUNITY): Payer: PRIVATE HEALTH INSURANCE

## 2015-07-12 DIAGNOSIS — Y9389 Activity, other specified: Secondary | ICD-10-CM | POA: Insufficient documentation

## 2015-07-12 DIAGNOSIS — S300XXA Contusion of lower back and pelvis, initial encounter: Secondary | ICD-10-CM | POA: Diagnosis not present

## 2015-07-12 DIAGNOSIS — F17210 Nicotine dependence, cigarettes, uncomplicated: Secondary | ICD-10-CM | POA: Diagnosis not present

## 2015-07-12 DIAGNOSIS — G8929 Other chronic pain: Secondary | ICD-10-CM | POA: Diagnosis not present

## 2015-07-12 DIAGNOSIS — Z8719 Personal history of other diseases of the digestive system: Secondary | ICD-10-CM | POA: Diagnosis not present

## 2015-07-12 DIAGNOSIS — Y9289 Other specified places as the place of occurrence of the external cause: Secondary | ICD-10-CM | POA: Diagnosis not present

## 2015-07-12 DIAGNOSIS — Y998 Other external cause status: Secondary | ICD-10-CM | POA: Diagnosis not present

## 2015-07-12 DIAGNOSIS — S3992XA Unspecified injury of lower back, initial encounter: Secondary | ICD-10-CM | POA: Diagnosis present

## 2015-07-12 MED ORDER — TRAMADOL HCL 50 MG PO TABS: 50.0000 mg | ORAL_TABLET | Freq: Four times a day (QID) | ORAL | Status: AC | PRN

## 2015-07-12 MED ORDER — HYDROCODONE-ACETAMINOPHEN 5-325 MG PO TABS
1.0000 | ORAL_TABLET | Freq: Once | ORAL | Status: AC
  Administered 2015-07-12: 1 via ORAL
  Filled 2015-07-12: qty 1

## 2015-07-12 MED ORDER — KETOROLAC TROMETHAMINE 60 MG/2ML IM SOLN: 60.0000 mg | Freq: Once | INTRAMUSCULAR | Status: DC
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
## 2015-07-12 NOTE — Discharge Instructions (Signed)
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
## 2016-01-22 ENCOUNTER — Emergency Department (HOSPITAL_COMMUNITY)
Admission: EM | Admit: 2016-01-22 | Discharge: 2016-01-22 | Disposition: A | Discharge status: Home / Self Care | Payer: 59 | Attending: Emergency Medicine | Admitting: Emergency Medicine

## 2016-01-22 ENCOUNTER — Encounter (HOSPITAL_COMMUNITY): Payer: Self-pay | Admitting: Emergency Medicine

## 2016-01-22 ENCOUNTER — Emergency Department (HOSPITAL_COMMUNITY): Payer: 59

## 2016-01-22 DIAGNOSIS — M79671 Pain in right foot: Secondary | ICD-10-CM

## 2016-01-22 DIAGNOSIS — Z79899 Other long term (current) drug therapy: Secondary | ICD-10-CM | POA: Insufficient documentation

## 2016-01-22 DIAGNOSIS — F17210 Nicotine dependence, cigarettes, uncomplicated: Secondary | ICD-10-CM | POA: Diagnosis not present

## 2016-01-22 MED ORDER — DIPHENHYDRAMINE HCL 25 MG PO TABS: 25.0000 mg | ORAL_TABLET | Freq: Four times a day (QID) | ORAL | 0 refills | Status: AC

## 2016-01-22 MED ORDER — PREDNISONE 10 MG PO TABS: 20.0000 mg | ORAL_TABLET | Freq: Two times a day (BID) | ORAL | 0 refills | Status: AC

## 2016-01-22 MED ORDER — FAMOTIDINE 20 MG PO TABS: 20.0000 mg | ORAL_TABLET | Freq: Two times a day (BID) | ORAL | 0 refills | Status: AC

## 2016-01-22 MED ORDER — IBUPROFEN 400 MG PO TABS
600.0000 mg | ORAL_TABLET | Freq: Once | ORAL | Status: AC
  Administered 2016-01-22: 600 mg via ORAL
  Filled 2016-01-22: qty 2

## 2016-01-22 MED ORDER — DIPHENHYDRAMINE HCL 25 MG PO CAPS
25.0000 mg | ORAL_CAPSULE | Freq: Once | ORAL | Status: AC
  Administered 2016-01-22: 25 mg via ORAL
  Filled 2016-01-22: qty 1

## 2016-01-22 NOTE — ED Provider Notes (Signed)
[facility] Provider Note   CSN: [id number] Arrival date & time: 01/22/16  1714     History   Chief Complaint Chief Complaint  Patient presents with  . Foot Pain    HPI [name] is a 40 y.o. male who presents to the ED with foot pain. The pain is on the dorsum of the right foot. Patient reports that he woke this am and noted redness and pain. The area of redness has almost resolved but he continues to have swelling and pain. He has taken nothing for the pain.   The history is provided by the patient. No language interpreter was used.  Foot Pain  This is a new problem. The current episode started 12 to 24 hours ago. The problem occurs constantly. The problem has been gradually worsening. The symptoms are aggravated by walking and standing. Nothing relieves the symptoms.    Past Medical History:  Diagnosis Date  . Chronic knee pain   . GERD (gastroesophageal reflux disease)     Patient Active Problem List   Diagnosis Date Noted  . Peptic ulcer with perforation (HCC) 09/29/2013  . Hematemesis 03/06/2013  . Elevated LFTs 03/06/2013    Past Surgical History:  Procedure Laterality Date  . BIOPSY N/A 03/19/2013   Procedure: Gastric Biopsies;  Surgeon: West BaliSandi L Fields, MD;  Location: [facility];  Service: Endoscopy;  Laterality: N/A;  . ESOPHAGOGASTRODUODENOSCOPY (EGD) WITH PROPOFOL N/A 03/19/2013   Procedure: ESOPHAGOGASTRODUODENOSCOPY (EGD) WITH PROPOFOL;  Surgeon: West BaliSandi L Fields, MD;  Location: [facility];  Service: Endoscopy;  Laterality: N/A;  . GASTRORRHAPHY N/A 09/29/2013   Procedure: GASTRORRHAPHY;  Surgeon: Dalia HeadingMark A Jenkins, MD;  Location: [facility];  Service: General;  Laterality: N/A;  . LAPAROTOMY N/A 09/29/2013   Procedure: EXPLORATORY LAPAROTOMY;  Surgeon: Dalia HeadingMark A Jenkins, MD;  Location: [facility];  Service: General;  Laterality: N/A;  . None         Home Medications    Prior to Admission medications   Medication Sig Start Date End Date Taking? Authorizing  Provider  ampicillin (PRINCIPEN) 500 MG capsule Take 1 capsule (500 mg total) by mouth 4 (four) times daily. Patient not taking: Reported on 04/16/2015 10/03/13   Franky MachoMark Jenkins, MD  clarithromycin (BIAXIN) 500 MG tablet Take 1 tablet (500 mg total) by mouth 2 (two) times daily. Patient not taking: Reported on 04/16/2015 10/03/13   Franky MachoMark Jenkins, MD  dexamethasone (DECADRON) 4 MG tablet Take 1 tablet (4 mg total) by mouth 2 (two) times daily with a meal. Patient not taking: Reported on 04/16/2015 02/05/15   Ivery QualeHobson Bryant, PA-C  diclofenac (VOLTAREN) 75 MG EC tablet Take 1 tablet (75 mg total) by mouth 2 (two) times daily. Patient not taking: Reported on 04/16/2015 02/05/15   Ivery QualeHobson Bryant, PA-C  diphenhydrAMINE (BENADRYL) 25 MG tablet Take 1 tablet (25 mg total) by mouth every 6 (six) hours. 01/22/16   Ashir Kunz Orlene OchM Jamica Woodyard, NP  famotidine (PEPCID) 20 MG tablet Take 1 tablet (20 mg total) by mouth 2 (two) times daily. 01/22/16   Talasia Saulter Orlene OchM Travell Desaulniers, NP  HYDROcodone-acetaminophen (NORCO/VICODIN) 5-325 MG tablet Take 1 tablet by mouth every 4 (four) hours as needed. 04/16/15   Burgess AmorJulie Idol, PA-C  omeprazole (PRILOSEC) 20 MG capsule Take 1 capsule (20 mg total) by mouth daily. Patient not taking: Reported on 04/16/2015 10/03/13   Franky MachoMark Jenkins, MD  oxyCODONE-acetaminophen (PERCOCET) 7.5-325 MG per tablet Take 1-2 tablets by mouth every 4 (four) hours as needed. Patient not taking: Reported on  04/16/2015 10/03/13   Franky MachoMark Jenkins, MD  predniSONE (DELTASONE) 10 MG tablet Take 2 tablets (20 mg total) by mouth 2 (two) times daily with a meal. 01/22/16   Lola Czerwonka Orlene OchM Keonna Raether, NP  traMADol (ULTRAM) 50 MG tablet Take 1 tablet (50 mg total) by mouth every 6 (six) hours as needed for severe pain. 07/12/15   Loren Raceravid Yelverton, MD    Family History Family History  Problem Relation Age of Onset  . Kidney failure Father   . Colon cancer Neg Hx     Social History Social History  Substance Use Topics  . Smoking status: Current Every Day Smoker     Packs/day: 0.50    Years: 15.00    Types: Cigarettes  . Smokeless tobacco: Never Used  . Alcohol use Yes     Comment: beer twice a week.      Allergies   Contrast media [iodinated diagnostic agents]   Review of Systems Review of Systems Negative except as stated in HPI and PMH  Physical Exam Updated Vital Signs BP 124/78 (BP Location: Left Arm)   Pulse 73   Temp 98.5 F (36.9 C) (Oral)   Resp 18   Ht 5\' 11"  (1.803 m)   Wt 86.2 kg   SpO2 96%   BMI 26.50 kg/m   Physical Exam  Constitutional: He is oriented to person, place, and time. He appears well-developed and well-nourished.  HENT:  Head: Normocephalic and atraumatic.  Eyes: EOM are normal.  Neck: Neck supple.  Cardiovascular: Normal rate.   Pulmonary/Chest: Effort normal. No respiratory distress.  Abdominal: Soft. There is no tenderness.  Musculoskeletal: Normal range of motion.       Right foot: There is tenderness and swelling. There is normal range of motion, normal capillary refill, no deformity and no laceration.       Feet:  There is a small area on the dorsum of the right foot that appears as a possible insect sting. There is point tenderness at the site. The dorsum of the foot has slightly increased warmth, no erythema or streaking noted. Pedal pulses 2+.   Neurological: He is alert and oriented to person, place, and time. No cranial nerve deficit.  Skin: Skin is warm and dry. Capillary refill takes less than 2 seconds.  Psychiatric: He has a normal mood and affect. His behavior is normal.  Nursing note and vitals reviewed.    ED Treatments / Results  Labs (all labs ordered are listed, but only abnormal results are displayed) Labs Reviewed - No data to display   Radiology Dg Foot Complete Right  Result Date: 01/22/2016 CLINICAL DATA:  Swelling and pain of the dorsum of the the right foot today with no known injury. Pt stated he just woke up today with the pain and the swelling. No previous injury  EXAM: RIGHT FOOT COMPLETE - 3+ VIEW COMPARISON:  None. FINDINGS: Mild hallux valgus deformity. No acute fracture or dislocation. No focal osseous lesion. No significant soft tissue swelling. IMPRESSION: No acute osseous abnormality. Hallux valgus deformity. Electronically Signed   By: Jeronimo GreavesKyle  Talbot M.D.   On: 01/22/2016 18:39    Procedures Procedures (including critical care time)  Medications Ordered in ED Medications  diphenhydrAMINE (BENADRYL) capsule 25 mg (25 mg Oral Given 01/22/16 1736)  ibuprofen (ADVIL,MOTRIN) tablet 600 mg (600 mg Oral Given 01/22/16 1735)     Initial Impression / Assessment and Plan / ED Course  I have reviewed the triage vital signs and the nursing notes.  Pertinent imaging results that were available during my care of the patient were reviewed by me and considered in my medical decision making (see chart for details).  Clinical Course   Benadryl and ibuprofen given during ED visit. Patient reports that the foot is actually better than it was earlier today. He reports that earlier it was more swollen and there was redness.   Final Clinical Impressions(s) / ED Diagnoses  40 y.o. male with right foot tenderness and swelling stable for d/c without erythema, fever, red streaking and does not appear toxic. Will treat for local allergic reaction.  Final diagnoses:  Foot pain, right    New Prescriptions Discharge Medication List as of 01/22/2016  7:00 PM    START taking these medications   Details  diphenhydrAMINE (BENADRYL) 25 MG tablet Take 1 tablet (25 mg total) by mouth every 6 (six) hours., Starting Fri 01/22/2016, Print    famotidine (PEPCID) 20 MG tablet Take 1 tablet (20 mg total) by mouth 2 (two) times daily., Starting Fri 01/22/2016, Print    predniSONE (DELTASONE) 10 MG tablet Take 2 tablets (20 mg total) by mouth 2 (two) times daily with a meal., Starting Fri 01/22/2016, Print         [place], NP 01/23/16 0136    Margarita Grizzle, MD 01/28/16  1242

## 2016-01-22 NOTE — ED Notes (Signed)
Pt alert & oriented x4, stable gait. Patient given discharge instructions, paperwork & prescription(s). Patient  instructed to stop at the registration desk to finish any additional paperwork. Patient verbalized understanding. Pt left department w/ no further questions. 

## 2016-01-22 NOTE — Discharge Instructions (Signed)
The x-rays of your foot tonight show no fracture, dislocation or foreign body. The area that is painful appears to have an area of possible insect sting. We are treating you for local reaction. Take the medication as directed. If you develop fever, red streaking or increased pain and redness return.

## 2016-01-22 NOTE — ED Triage Notes (Signed)
Pt states he woke up with redness and pain in right foot with no injury.

## 2016-10-14 IMAGING — DX DG FOOT COMPLETE 3+V*R*
3 series · 3 of 3 positions shown · non-contrast
Comparison: None.

CLINICAL DATA: Swelling and pain of the dorsum of the the right
foot today with no known injury. Pt stated he just woke up today
with the pain and the swelling. No previous injury

EXAM:
RIGHT FOOT COMPLETE - 3+ VIEW

[foot ap]
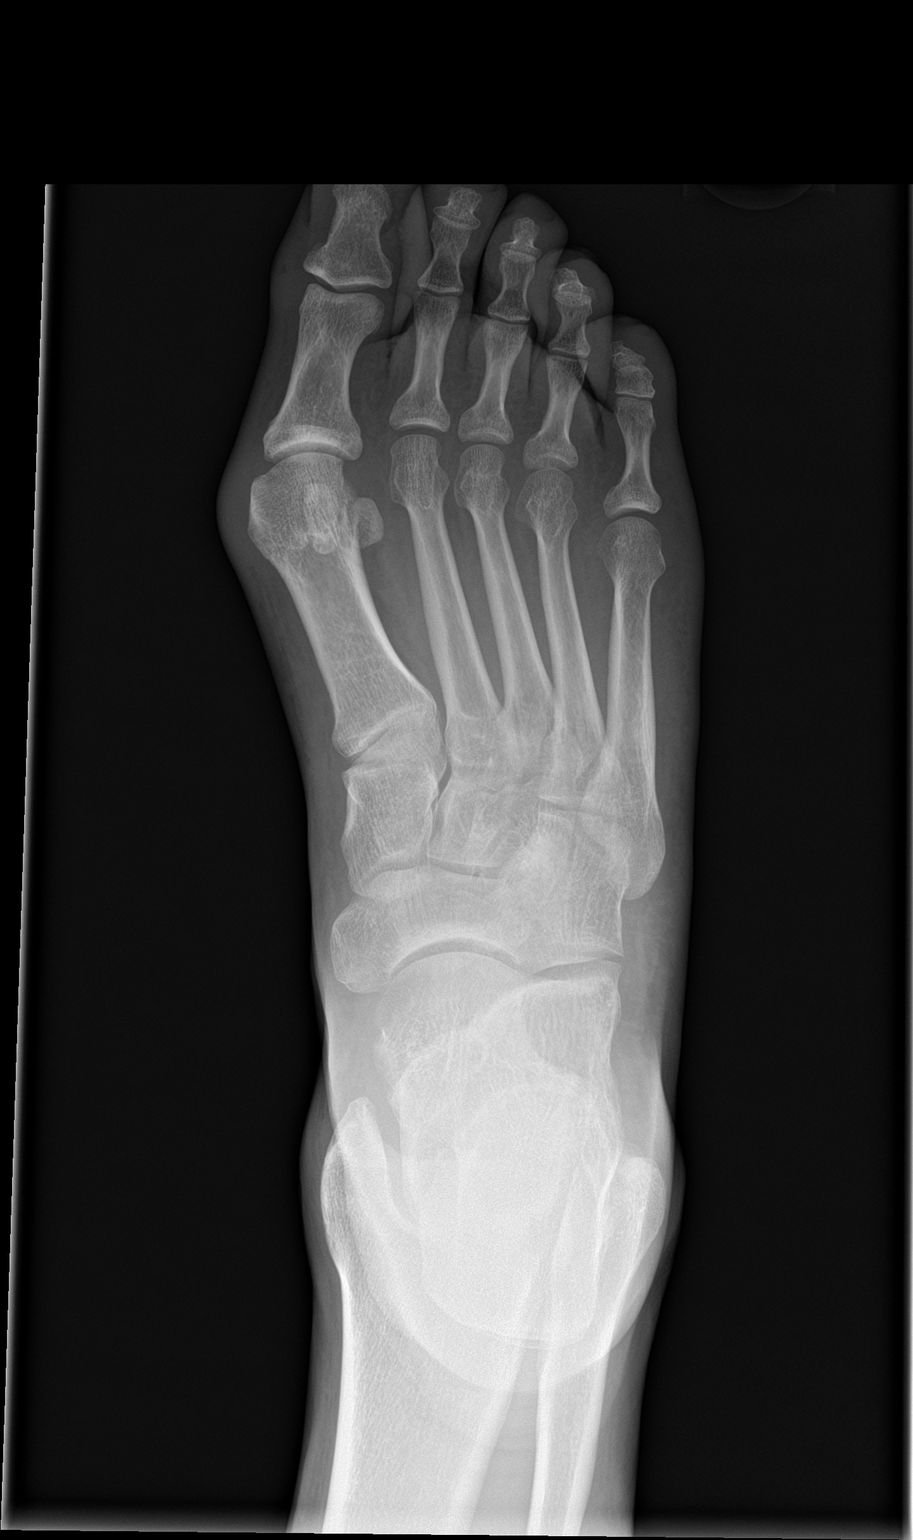

[foot obl]
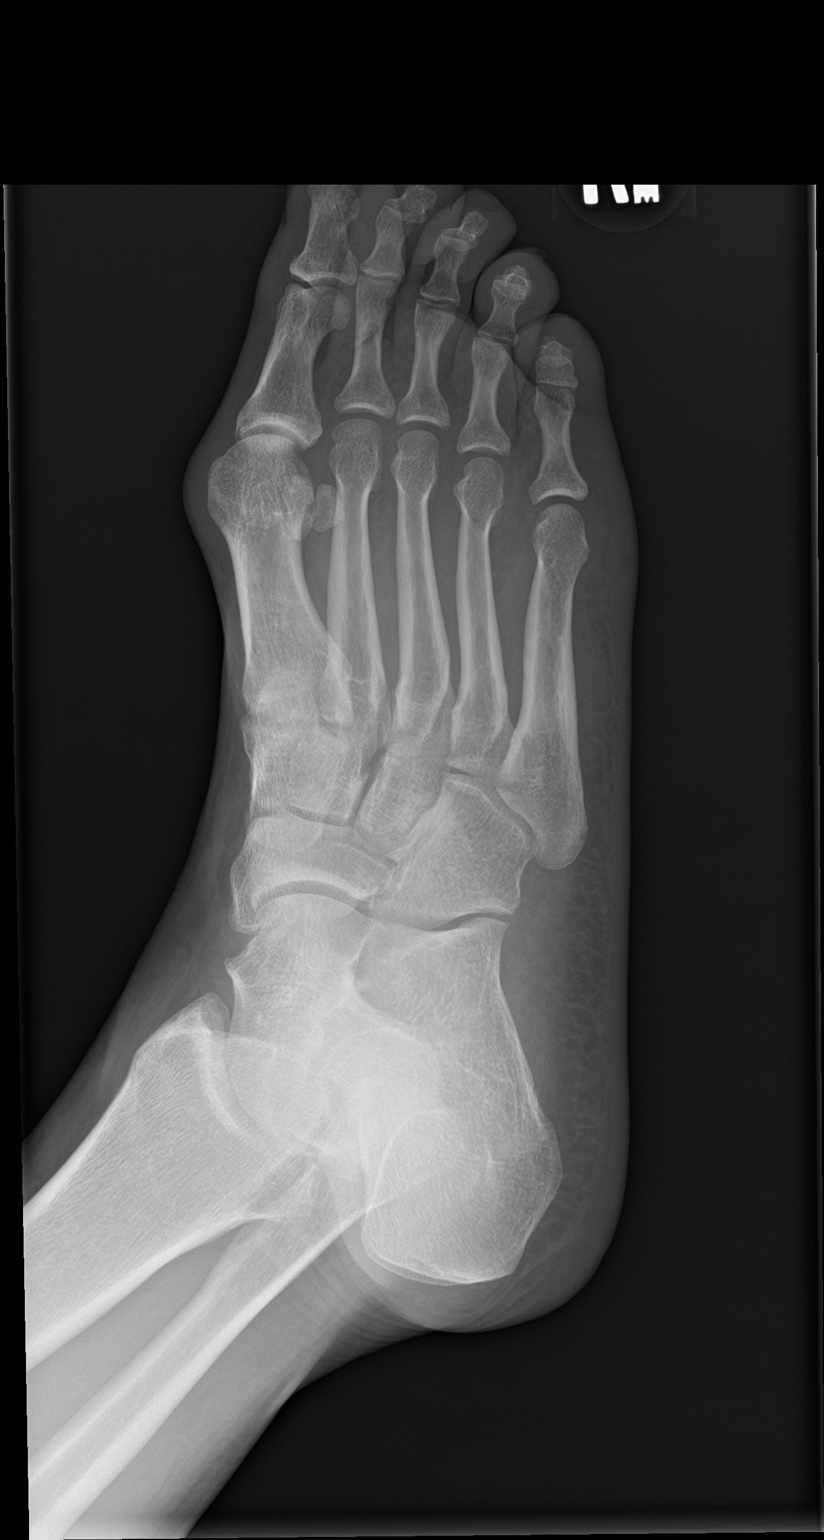

[foot lat]
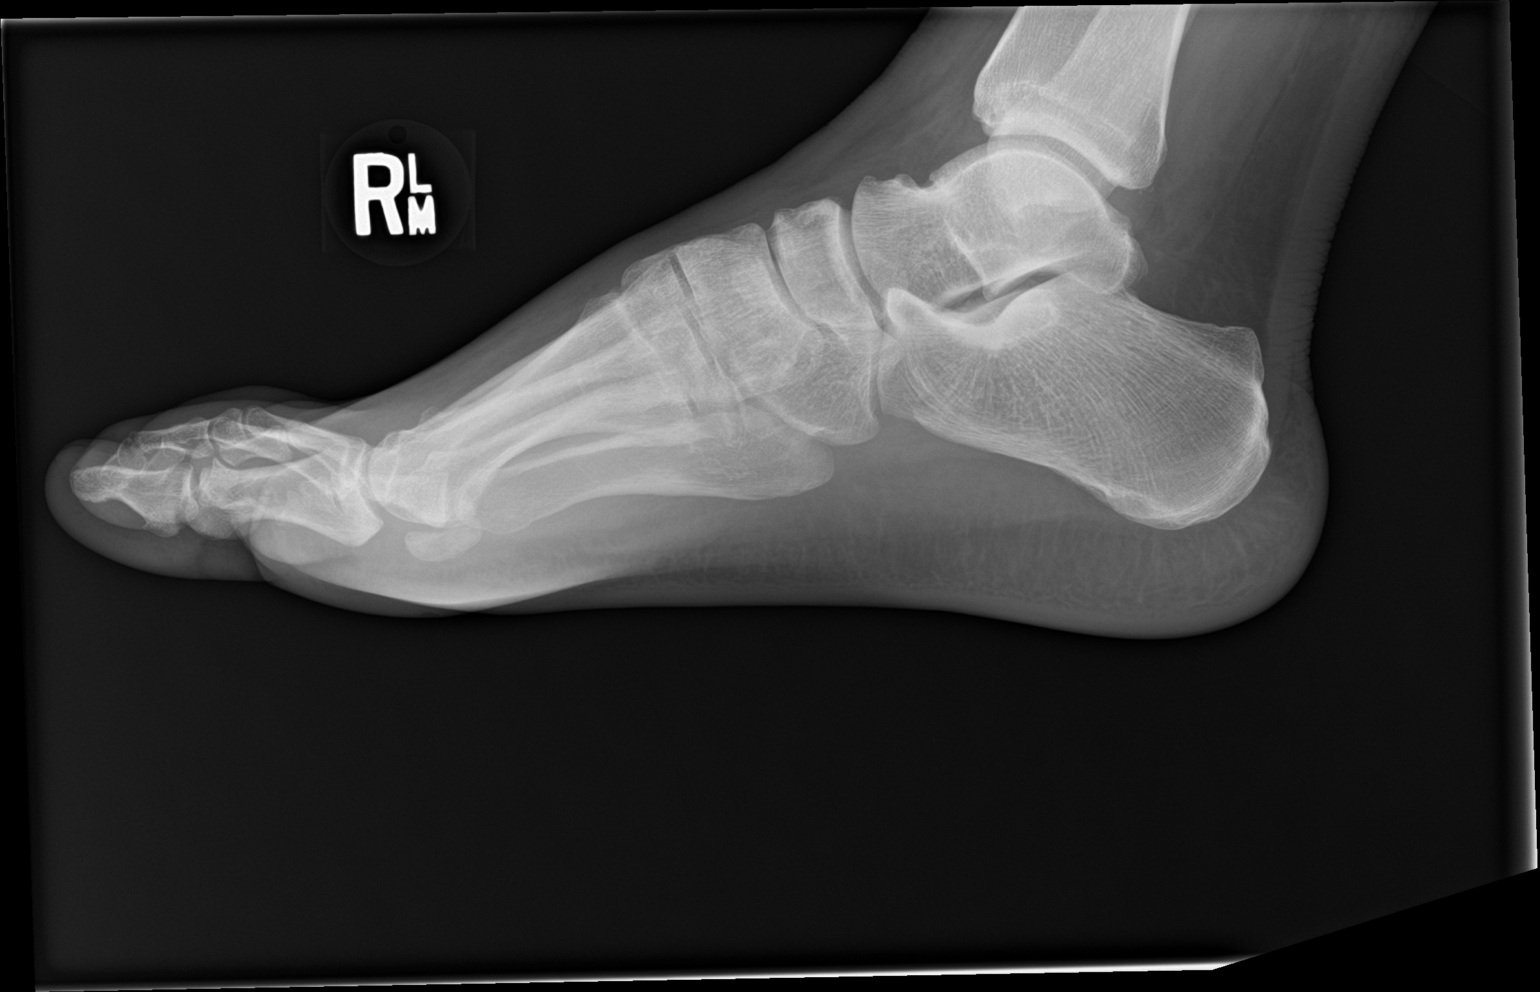

[3 of 3 positions shown; findings below may reference images not displayed]

FINDINGS: Mild hallux valgus deformity. No acute fracture or dislocation. No
focal osseous lesion. No significant soft tissue swelling.
IMPRESSION: No acute osseous abnormality.

Hallux valgus deformity.
